# Patient Record
Sex: Female | Born: 1990 | Race: White | Hispanic: No | Marital: Single | Smoking: Never smoker
Health system: Southern US, Community
[De-identification: ages and names within clinical notes are randomized; demographics above are authoritative.]

## PROBLEM LIST (undated history)

## (undated) DIAGNOSIS — Z8619 Personal history of other infectious and parasitic diseases: Secondary | ICD-10-CM

## (undated) DIAGNOSIS — R87612 Low grade squamous intraepithelial lesion on cytologic smear of cervix (LGSIL): Secondary | ICD-10-CM

## (undated) DIAGNOSIS — J029 Acute pharyngitis, unspecified: Principal | ICD-10-CM

## (undated) DIAGNOSIS — J321 Chronic frontal sinusitis: Secondary | ICD-10-CM

## (undated) DIAGNOSIS — N871 Moderate cervical dysplasia: Secondary | ICD-10-CM

## (undated) DIAGNOSIS — K219 Gastro-esophageal reflux disease without esophagitis: Secondary | ICD-10-CM

## (undated) DIAGNOSIS — J45990 Exercise induced bronchospasm: Secondary | ICD-10-CM

## (undated) DIAGNOSIS — R41840 Attention and concentration deficit: Secondary | ICD-10-CM

## (undated) DIAGNOSIS — L709 Acne, unspecified: Secondary | ICD-10-CM

## (undated) DIAGNOSIS — N946 Dysmenorrhea, unspecified: Secondary | ICD-10-CM

## (undated) HISTORY — DX: Low grade squamous intraepithelial lesion on cytologic smear of cervix (LGSIL): R87.612

## (undated) HISTORY — DX: Exercise induced bronchospasm: J45.990

## (undated) HISTORY — DX: Attention and concentration deficit: R41.840

## (undated) HISTORY — DX: Acute pharyngitis, unspecified: J02.9

## (undated) HISTORY — PX: MYRINGOTOMY: SUR874

## (undated) HISTORY — DX: Dysmenorrhea, unspecified: N94.6

## (undated) HISTORY — DX: Personal history of other infectious and parasitic diseases: Z86.19

## (undated) HISTORY — DX: Acne, unspecified: L70.9

## (undated) HISTORY — DX: Moderate cervical dysplasia: N87.1

## (undated) HISTORY — DX: Chronic frontal sinusitis: J32.1

## (undated) HISTORY — DX: Gastro-esophageal reflux disease without esophagitis: K21.9

---
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
## 2010-01-04 ENCOUNTER — Ambulatory Visit: Payer: Self-pay | Admitting: Internal Medicine

## 2010-01-04 ENCOUNTER — Telehealth: Payer: Self-pay | Admitting: Internal Medicine

## 2010-01-07 ENCOUNTER — Encounter: Payer: Self-pay | Admitting: Internal Medicine

## 2010-01-11 ENCOUNTER — Ambulatory Visit: Payer: Self-pay | Admitting: Internal Medicine

## 2010-02-25 ENCOUNTER — Telehealth: Payer: Self-pay | Admitting: Internal Medicine

## 2010-04-02 ENCOUNTER — Telehealth: Payer: Self-pay | Admitting: *Deleted

## 2010-04-03 ENCOUNTER — Ambulatory Visit: Payer: Self-pay | Admitting: Internal Medicine

## 2010-04-03 ENCOUNTER — Telehealth (INDEPENDENT_AMBULATORY_CARE_PROVIDER_SITE_OTHER): Payer: Self-pay | Admitting: *Deleted

## 2010-04-03 DIAGNOSIS — J029 Acute pharyngitis, unspecified: Secondary | ICD-10-CM | POA: Insufficient documentation

## 2010-04-03 LAB — CONVERTED CEMR LAB: Rapid Strep: NEGATIVE

## 2010-04-04 ENCOUNTER — Ambulatory Visit: Payer: Self-pay | Admitting: Internal Medicine

## 2010-04-05 ENCOUNTER — Ambulatory Visit: Payer: Self-pay | Admitting: Family Medicine

## 2010-04-05 LAB — CONVERTED CEMR LAB: Rapid Strep: NEGATIVE

## 2010-04-06 ENCOUNTER — Encounter: Payer: Self-pay | Admitting: Internal Medicine

## 2010-04-09 ENCOUNTER — Ambulatory Visit: Payer: Self-pay | Admitting: Internal Medicine

## 2010-04-10 ENCOUNTER — Ambulatory Visit
Admission: RE | Admit: 2010-04-10 | Discharge: 2010-04-10 | Payer: Self-pay | Source: Home / Self Care | Attending: Internal Medicine | Admitting: Internal Medicine

## 2010-04-10 DIAGNOSIS — K639 Disease of intestine, unspecified: Secondary | ICD-10-CM | POA: Insufficient documentation

## 2010-04-29 ENCOUNTER — Encounter: Payer: Self-pay | Admitting: Internal Medicine

## 2010-05-01 ENCOUNTER — Encounter: Admission: RE | Admit: 2010-05-01 | Discharge: 2010-05-01 | Payer: Self-pay | Source: Home / Self Care

## 2010-05-16 NOTE — Progress Notes (Signed)
Summary: refill med  Phone Note Call from Patient Call back at [phone]   Caller: Mom Call For: panosh Summary of Call: Wants to change the methylphenidate from 20mg  to 30mg .  The 20mg  is still not enough but the 10mg  is ok so she needs a refill on that one to.  Also she had a sinus infection on Wednesday and missed her exercise class.  The instructor is asking for a dr note and she wants to know if you will give her one. Initial call taken by: Alfred Levins, CMA,  February 25, 2010 8:43 AM  Follow-up for Phone Call        Doies she mean  methylphenidate er 30 mg? ok  1 by mouth once daily  disp 30 . and refill  the immediate release [facility] 10 mg disp#30   Note excuse  wont help  as i  would have to say that  her illness was  self reported.  "  Patient said she had sinus infection and missed class for medical reasons ".   Follow-up by: Madelin Headings MD,  February 25, 2010 5:22 PM  Additional Follow-up for Phone Call Additional follow up Details #1::        rx up front, ready for p/u, mom aware Additional Follow-up by: Alfred Levins, CMA,  February 25, 2010 5:36 PM    New/Updated Medications: METADATE CD 30 MG CR-CAPS (METHYLPHENIDATE HCL) 1 by mouth once daily Prescriptions: METHYLPHENIDATE HCL 10 MG TABS (METHYLPHENIDATE HCL) 1/2 to one in afternoon for homework if needed  #30 x 0   Entered by:   Alfred Levins, CMA   Authorized by:   Madelin Headings MD   Signed by:   Alfred Levins, CMA on 02/25/2010   Method used:   Print then Give to Patient   RxID:   [id number] METADATE CD 30 MG CR-CAPS (METHYLPHENIDATE HCL) 1 by mouth once daily  #30 x 0   Entered by:   Alfred Levins, CMA   Authorized by:   Madelin Headings MD   Signed by:   Alfred Levins, CMA on 02/25/2010   Method used:   Print then Give to Patient   RxID:   [id number]

## 2010-05-16 NOTE — Assessment & Plan Note (Signed)
Summary: flu shot/ssc  Nurse Visit   Allergies: No Known Drug Allergies  Immunizations Administered:  Influenza Vaccine # 1:    Vaccine Type: Fluvax 3+    Site: left deltoid    Mfr: GlaxoSmithKline    Dose: 0.5 ml    Route: IM    Given by: Kathrynn Speed CMA    Exp. Date: 10/12/2010    Lot #: [id number]    VIS given: 11/06/09 version given January 04, 2010.  Flu Vaccine Consent Questions:    Do you have a history of severe allergic reactions to this vaccine? no    Any prior history of allergic reactions to egg and/or gelatin? no    Do you have a sensitivity to the preservative Thimersol? no    Do you have a past history of Guillan-Barre Syndrome? no    Do you currently have an acute febrile illness? no    Have you ever had a severe reaction to latex? no    Vaccine information given and explained to patient? yes    Are you currently pregnant? no  Orders Added: 1)  Flu Vaccine 60yrs + [90658] 2)  Admin 1st Vaccine [44010]

## 2010-05-16 NOTE — Assessment & Plan Note (Signed)
Summary: FUP--OK PER SHANNON//CCM   Vital Signs:  Patient profile:   20 year old female Menstrual status:  regular LMP:     06/13/2009 Weight:      146 pounds Pulse rate:   66 / minute BP sitting:   120 / 60  (right arm) Cuff size:   regular  Vitals Entered By: Romualdo Bolk, CMA (AAMA) (June 22, 2009 1:48 PM)  History of Present Illness: Amanda Garrison comesin today for   for follow up of a number of issues. OCPS:   Loestrin 24 much better for cramps regulation  than previous loestrin.  Acne better also. SA    no dysuria abd pain vag signs . No breast problems HA or cp sob except  related to   EIA.    EIA: uses inhaler when thinks of it . no other exacerbations  see phone notes.   SLeep . Had terrible insomnia for a month or so at beginning of semester that was problematic but much better since spring break . used melatonin no other meds . lifestyle intervention .   other . denies physical cause  noted or pain.     had different school scedule early am some days which was a change in pattern. some stress over school but not felt to be enough to cause sig proble,    denies Depressive signs .   Does study lat into night with laptop but has done this before.   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    no ets  college   [employer]  freshman  current psychology major   Review of Systems  The patient denies anorexia, fever, weight loss, weight gain, vision loss, decreased hearing, hoarseness, chest pain, syncope, dyspnea on exertion, peripheral edema, prolonged cough, headaches, hemoptysis, abdominal pain, melena, hematochezia, severe indigestion/heartburn, hematuria, genital sores, difficulty walking, depression, unusual weight change, abnormal bleeding, enlarged lymph nodes, angioedema, and breast masses.         except as per HPI  Physical Exam  General:      Well appearing adolescent,no acute distress Head:      normocephalic and atraumatic  Eyes:      PERRL, EOMs full, conjunctiva clear   Ears:      TM's pearly gray with normal light reflex and landmarks, canals clear  Nose:      Clear without Rhinorrhea Mouth:      Clear without erythema, edema or exudate, mucous membranes moist teeth in good rpair Neck:      supple without adenopathy  Lungs:      Clear to ausc, no crackles, rhonchi or wheezing, no grunting, flaring or retractions  Heart:  RRR without murmur quiet precordium.   Abdomen:      BS+, soft, non-tender, no masses, no hepatosplenomegaly  Pulses:      pulses intact without delay   Extremities:      no clubbing cyanosis or edema  Neurologic:      Neurologic exam  intact  non focal  Skin:      intact without lesions, rashes  Cervical nodes:      no significant adenopathy.   Psychiatric:      alert and cooperative  nl affect and  cognition and speech.  Impression & Recommendations:  Problem # 1:  DYSMENORRHEA (ICD-625.3) Assessment Improved continue on loestrin 24 Orders: Hgb (09811)  Problem # 2:  ACNE (ICD-706.1) continue  The following medications were removed from the medication list:    Bactrim Ds 800-160 Mg Tabs (Sulfamethoxazole-trimethoprim) .Marland Kitchen... 1 by mouth once daily    Amoxicillin-pot  Clavulanate 875-125 Mg Tabs (Amoxicillin-pot clavulanate) .Marland Kitchen... 1 by mouth two times a day  for sinusitis    Zithromax Z-pak 250 Mg Tabs (Azithromycin) .Marland Kitchen... 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 2 puffs every 4-6 hours as needed  Problem # 4:  PREVENTIVE HEALTH CARE (ICD-V70.0) disc screening and options      Orders: Hgb (91478) T-Chlamydia & GC Probe, Urine (87491/[id number])  Problem # 5:  GERD (ICD-530.81) stable  Her updated medication list for this problem includes:    Ranitidine Hcl 300 Mg Tabs (Ranitidine hcl) .Marland Kitchen... Take 1 tablet twice a day  Problem # 6:  UNSPECIFIED SLEEP DISTURBANCE (ICD-780.50) has been problematic but better currently.  counseled  about sleep hygiene and  circadian rhythyms and issues that effect sleep . rec against meds  as a solution although can be used for sleep cycling if needed.  if recurring consider  CBT counseling .  Also look at her class schedules.   sleep diary given . tracking for a few weeks may be helpful to her.   Complete Medication List: 1)  Proair Hfa 108 (90 Base) Mcg/act Aers (Albuterol sulfate) .... 2 puffs every 4-6 hours as needed 2)  Loestrin 24 Fe 1-20 Mg-mcg Tabs (Norethin ace-eth estrad-fe) .Marland Kitchen.. 1 by mouth once daily 3)  Ranitidine Hcl 300 Mg Tabs (Ranitidine hcl) .... Take 1 tablet twice a day  Patient Instructions: 1)  sleep diary    2)  You will be informed of lab results when available.  3)  otherwise     rov in 6-12 months  Prescriptions: LOESTRIN 24 FE 1-20 MG-MCG TABS (NORETHIN ACE-ETH ESTRAD-FE) 1 by mouth once daily  #1 pack x 12   Entered and Authorized by:   Madelin Headings MD   Signed by:   Madelin Headings MD on 06/22/2009   Method used:   Electronically to        [facility]       [address]       [place], [place]   [address]       Ph: [phone]       Fax: [phone]   RxID:   [id number] PROAIR HFA 108 (90 BASE) MCG/ACT  AERS (ALBUTEROL SULFATE) 2 puffs every 4-6 hours as needed  #1 x 3   Entered and Authorized by:   [name] Paulette Lynch  MD   Signed by:   Madelin Headings MD on 06/22/2009   Method used:   Electronically to        [facility])       [address]       [place], [place]  [address]       Ph: [phone]       Fax: [phone]   RxID:   [id number]  ] Laboratory Results   Blood Tests   Date/Time Recieved: June 22, 2009 2:52 PM  Date/Time Reported: June 22, 2009 2:52 PM    CBC HGB:  12.0 g/dL   (Normal Range: 40.1-02.7 in Males, 12.0-15.0 in Females) Comments: Wynona Canes, CMA  June 22, 2009 2:52 PM

## 2010-05-16 NOTE — Progress Notes (Signed)
Summary: rx for retin a micro  Phone Note Call from Patient Call back at [phone]   Caller: Mom-Terri Summary of Call: Pt needs a refill on Retin A Micro 0.1% gel. Pt does see a Derm for this but can't get in until she goes to college this fall. Mom is wondering if we could call this rx in for her.  Initial call taken by: Romualdo Bolk, CMA Duncan Dull),  October 03, 2009 2:16 PM  Follow-up for Phone Call        ok x 2  Follow-up by: Madelin Headings MD,  October 03, 2009 8:02 PM  Additional Follow-up for Phone Call Additional follow up Details #1::        Rx sent to pharmacy and mom aware of results. Additional Follow-up by: Romualdo Bolk, CMA (AAMA),  October 04, 2009 9:27 AM    New/Updated Medications: RETIN-A MICRO 0.1 % GEL (TRETINOIN MICROSPHERE) use as directed Prescriptions: RETIN-A MICRO 0.1 % GEL (TRETINOIN MICROSPHERE) use as directed  #30 x 1   Entered by:   Romualdo Bolk, CMA (AAMA)   Authorized by:   Madelin Headings MD   Signed by:   Romualdo Bolk, CMA (AAMA) on 10/04/2009   Method used:   Electronically to        [facility]       [address]       [place], [place]  [address]       Ph: [phone]       Fax: [phone]   RxID:   [id number]

## 2010-05-16 NOTE — Assessment & Plan Note (Signed)
Summary: SORETHROAT/FEVER/SWOLLEN GLANDS/CJR   Vital Signs:  Patient profile:   20 year old female Menstrual status:  regular Weight:      142 pounds O2 Sat:      99 % Temp:     98.6 degrees F Pulse rate:   78 / minute BP sitting:   104 / 60  (left arm)  Vitals Entered By: Pura Spice, RN (April 05, 2010 2:29 PM) CC: sore throat x 4 days "been sick for couple weeks" , URI symptoms   History of Present Illness:       This is a 20 year old woman who presents with URI symptoms.  The patient complains of nasal congestion, purulent nasal discharge, sore throat, and dry cough.  The patient denies fever.  The patient also reports headache.  The patient denies itchy watery eyes and sneezing.  Risk factors for Strep sinusitis include poor response to decongestant and tender adenopathy.  The patient denies the following risk factors for Strep sinusitis: unilateral facial pain and tooth pain.    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  Otoscopic examination reveals clear canals, tympanic membranes are intact bilaterally without bulging, retraction, inflammation or discharge. Hearing is grossly normal bilaterally. Mouth:  OP post vesicles/ulcers.  No exudate. Neck:  tender AC nodes Lungs:  Normal respiratory effort, chest expands symmetrically. Lungs are clear to auscultation, no crackles or wheezes. Heart:  Normal rate and regular rhythm. S1 and S2 normal without gallop, murmur, click, rub or other extra sounds.   Impression & Recommendations:  Problem # 1:  ACUTE  PHARYNGITIS (ICD-462)  rapid strep neg.  Suspect viral with appearance of post pharynx.  Supportive care. Her updated medication list for this problem includes:    Amoxicillin-pot Clavulanate 875-125 Mg Tabs (Amoxicillin-pot clavulanate) ..... One by mouth two times a day for 10 days  Orders: Rapid Strep (30865)  Problem # 2:  ? of OTHER ACUTE SINUSITIS (ICD-461.8) ?lingering acute sinusits.  No antibiotics rec at this time but start if recurrent facial pain, fever, purulent nasal discharge. Her updated medication list for this problem includes:    Amoxicillin-pot Clavulanate 875-125 Mg Tabs (Amoxicillin-pot clavulanate) ..... One by mouth two times a day for 10 days  Complete Medication List: 1)  Proair Hfa 108 (90 Base) Mcg/act Aers (Albuterol sulfate) .... 2 puffs every 4-6 hours as needed 2)  Loestrin 24 Fe 1-20 Mg-mcg Tabs (Norethin ace-eth estrad-fe) .Marland Kitchen.. 1 by mouth once daily 3)  Ranitidine Hcl 300 Mg Tabs (Ranitidine hcl) .... Take 1 tablet twice a day 4)  Retin-a Micro 0.1 % Gel (Tretinoin microsphere) .... Use as directed 5)  Metadate Cd 30 Mg Cr-caps (Methylphenidate hcl) .Marland Kitchen.. 1 by mouth once daily 6)  Methylphenidate Hcl 10 Mg Tabs (Methylphenidate hcl) .... 1/2 to one in afternoon for homework if needed 7)  Amoxicillin-pot Clavulanate 875-125 Mg Tabs (Amoxicillin-pot clavulanate) .... One by mouth two times a day for 10 days 8)  Hydrocodone-acetaminophen 5-325 Mg Tabs (Hydrocodone-acetaminophen) .Marland Kitchen.. 1-2 by mouth q 4-6 hours as needed Prescriptions: HYDROCODONE-ACETAMINOPHEN 5-325 MG TABS (HYDROCODONE-ACETAMINOPHEN)  1-2 by mouth q 4-6 hours as needed  #20 x 0   Entered and Authorized by:   Evelena Peat MD   Signed by:   Evelena Peat MD on 04/05/2010   Method used:   Print then Give to Patient   RxID:   [id number] AMOXICILLIN-POT CLAVULANATE 875-125 MG TABS (AMOXICILLIN-POT CLAVULANATE) one by mouth two times a day for 10 days  #20 x 0   Entered and Authorized  by:   Evelena Peat MD   Signed by:   Evelena Peat MD on 04/05/2010   Method used:   Print then Give to Patient   RxID:   [id number]    Orders Added: 1)  Est. Patient Level III [84696] 2)  Rapid Strep [29528]    Laboratory Results    Other Tests  Rapid Strep: negative Comments: Rita Ohara  April 05, 2010 2:22 PM   Kit Test Internal QC: Negative   (Normal Range: Negative)

## 2010-05-16 NOTE — Letter (Signed)
Summary: [facility]   Imported By: Maryln Gottron 01/17/2010 08:51:12  _____________________________________________________________________  External Attachment:    Type:   Image     Comment:   External Document

## 2010-05-16 NOTE — Progress Notes (Signed)
Summary: Change in her ADD Meds needs a Friday PM appt   Phone Note Call from Patient Call back at [phone]   Caller: Mom-Terri Summary of Call: Pt is going [place] [facility] on Friday 9/23 and wanted to come in to see [place] that same day about medication changes. Mom is aware that Dr. Fabian Sharp is out of the office that day. Pt is going to drop off a note from Dr. Jennette Banker visit to let us know what he thinks as well. I told mom that I would see if Dr. Fabian Sharp could maybe change her medication over the phone then have her come in on a friday in 1 month but that I COULDN'T BE SURE THIS WOULD HAPPEN AND THAT SHE MAY STILL NEED TO HAVE TO COME IN BEFORE WE COULD CHANGE THE MEDICATION. Mom agree that either way would be fine but she would need a friday pm appt because pt can only come home on fridays. Pt to bring in note after seeing Dr. Ledon Snare to me personally. Initial call taken by: Romualdo Bolk, CMA Duncan Dull),  December 21, 2009 10:27 AM  Follow-up for Phone Call        i agree need  to review evaluations  and psychologist recs   before deciding on medicine management .     Needs OV and  information before the visit  I will not be in the office on Sept 23  but will be on Sept 30th  Follow-up by: Madelin Headings MD,  December 21, 2009 1:18 PM  Additional Follow-up for Phone Call Additional follow up Details #1::        Left message on machine about above. Additional Follow-up by: Romualdo Bolk, CMA Duncan Dull),  December 21, 2009 4:53 PM     Appended Document: Change in her ADD Meds needs a Friday PM appt  See if pt can come in at 12:45pm per Dr. Fabian Sharp  Appended Document: Change in her ADD Meds needs a Friday PM appt  Mom aware appt made for 11:15am but will call pt to make sure she can come in that slot and call back to confirm appt.

## 2010-05-16 NOTE — Progress Notes (Signed)
Summary: Pts needs work in med check before 04/20/10  Phone Note Call from Patient Call back at [phone] Terrys cell    Caller: mom-Terry Summary of Call: Pts daughter need fup med check before she goes back to college on Jan 7th 2012. Pt will be in town from 04/02/10 to 04/20/10. Pls advise.  Initial call taken by: Lucy Antigua,  April 02, 2010 11:45 AM  Follow-up for Phone Call        Mom called saying that pt is doing fine with meds. Pt is having sore throat with white spots following 2 weeks of a URI. Mom wants pt worked in first thing in the am. Can we have her come in for just a strep test then be seen on thurs? Mom doesn't want to take her to Urgent Care tonight. Follow-up by: Romualdo Bolk, CMA Duncan Dull),  April 02, 2010 4:42 PM  Additional Follow-up for Phone Call Additional follow up Details #1::        Left message on machine to come in at 8:30am for a follow up on meds. Also pt to come in at 8:30 on 12/27 for a follow. I told mom to leave a message on my machine about appts. Additional Follow-up by: Romualdo Bolk, CMA Duncan Dull),  April 02, 2010 5:25 PM    Additional Follow-up for Phone Call Additional follow up Details #2::    Pt aware and appt made. Follow-up by: Romualdo Bolk, CMA Duncan Dull),  April 03, 2010 8:47 AM

## 2010-05-16 NOTE — Progress Notes (Signed)
Summary: fever and swollen glands  Phone Note Call from Patient Call back at [phone]   Caller: Mom-Terri Summary of Call: Pt was feeling fine when she came in this am but then this afternoon she started running a fever, swollen glands. Temp 101. Mom states that pt told her that she is that unsure that they even got a good swab. Pt stated that she swabbed the back of her tongue.  Initial call taken by: Romualdo Bolk, CMA Duncan Dull),  April 03, 2010 5:00 PM  Follow-up for Phone Call        Per Tim Lair- Pt can come in tomorrow and get another swab done and we don't need to charge her. Follow-up by: Romualdo Bolk, CMA (AAMA),  April 03, 2010 5:00 PM

## 2010-05-16 NOTE — Letter (Signed)
Summary: Generic Letter  [place] [facility]  [address] [place], [place] [address]   Phone: [phone]  Fax: [phone]    07/11/2009  [facility] [address] [place], [place]  [address]  Dear Ms. Witherell,  We have tried to contact you by phone about your results. Your test were neg. If you have any other questions, please call us at [phone]         Sincerely,   Tor Netters, CMA (AAMA)

## 2010-05-16 NOTE — Assessment & Plan Note (Signed)
Summary: follow up on meds/ssc   Vital Signs:  Patient profile:   20 year old female Menstrual status:  regular LMP:     03/27/2010 Height:      70 inches Weight:      138 pounds Pulse rate:   72 / minute BP sitting:   110 / 70  (right arm) Cuff size:   regular  Vitals Entered By: Amanda Garrison, CMA (AAMA) (April 10, 2010 11:28 AM) CC: Follow-up visit on meds LMP (date): 03/27/2010 LMP - Character: heavy Menarche (age onset years): 16   Menses interval (days): 28 Menstrual flow (days): 4 Enter LMP: 03/27/2010   History of Present Illness: Amanda Garrison comes in today  for follow up of a number of issues. #REcent sinusitis and throat infection felt to be viral but started on the antibiotic and on the right getting better on augmentin .   also given  hydrocodone . #Had viral gi illness yesterday and didnt come in for presceduled appt then but is doing better today. doesnt think its the medication. either augmentin or hydrocodone.  #add meds : doing better on the methyphenidate  and lasts about 6-7 hours  adding IR lasting 2 or so hours for Amanda Garrison and other times.  Amanda Garrison seems to think it works well awith out  mood se.  #OCPS: doing well no Co needs refill . #Acne : needs refill of retina   #GI: needs refill of ranitidine   ..works well    Press photographer & Management  Alcohol-Tobacco     Alcohol drinks/day: 0     Smoking Status: never  Caffeine-Diet-Exercise     Caffeine use/day: less than 1 a day     Does Patient Exercise: yes  Current Medications (verified): 1)  Proair Hfa 108 (90 Base) Mcg/act  Aers (Albuterol Sulfate) .... 2 Puffs Every 4-6 Hours As Needed 2)  Loestrin 24 Fe 1-20 Mg-Mcg Tabs (Norethin Ace-Eth Estrad-Fe) .Marland Kitchen.. 1 By Mouth Once Daily 3)  Ranitidine Hcl 300 Mg  Tabs (Ranitidine Hcl) .... Take 1 Tablet Twice A Day 4)  Retin-A Micro 0.1 % Gel (Tretinoin Microsphere) .... Use As Directed 5)  Metadate Cd 30 Mg Cr-Caps (Methylphenidate  Hcl) .Marland Kitchen.. 1 By Mouth Once Daily 6)  Methylphenidate Hcl 10 Mg Tabs (Methylphenidate Hcl) .... 1/2 To One in Afternoon For Homework If Needed 7)  Amoxicillin-Pot Clavulanate 875-125 Mg Tabs (Amoxicillin-Pot Clavulanate) .... One By Mouth Two Times A Day For 10 Days 8)  Hydrocodone-Acetaminophen 5-325 Mg Tabs (Hydrocodone-Acetaminophen) .Marland Kitchen.. 1-2 By Mouth Q 4-6 Hours As Needed  Allergies (verified): No Known Drug Allergies  Past History:  Past medical, surgical, family and social histories (including risk factors) reviewed, and no changes noted (except as noted below).  Past Medical History: Reviewed history from 06/22/2009 and no changes required. 8# 9 oz recurrent om Mono 10/06 EIA       GERD  neg endo otherwise 2010 Varicella disease  Past Surgical History: Reviewed history from 06/22/2009 and no changes required. Myringotomy: tubes     1997 and 1998  Past History:  Care Management: Dermatology: Amanda Garrison Gastroenterology: Amanda Garrison Psychiatry: Amanda Garrison  Family History: Reviewed history from 01/11/2010 and no changes required. neg for clotting    sib on meds for adhd  Social History: Reviewed history from 01/11/2010 and no changes required. sleep ok  hh 5  1 dog.    no ets  college   Amanda Garrison    current psychology major   Review of Systems  The patient denies chest pain, melena, hematochezia, severe indigestion/heartburn, hematuria, difficulty walking, abnormal bleeding, angioedema, prolonged cough, headaches, and hemoptysis.         see hpi   some anorexia no fever currently  swollen glands getting better   Physical Exam  General:  minimally ill in nad some congestion Head:  normocephalic and atraumatic.   Eyes:  PERRL, EOMs full, conjunctiva clear  Ears:  R ear normal, L ear normal, and no external deformities.   Nose:  no external deformity and no external erythema.   Mouth:  slight erythema right more than left no exudate no edema  no facial tenderness today Neck:   tender right ac nodes neg pc nodes  Lungs:  Normal respiratory effort, chest expands symmetrically. Lungs are clear to auscultation, no crackles or wheezes. Heart:  Normal rate and regular rhythm. S1 and S2 normal without gallop, murmur, click, rub or other extra sounds. Abdomen:  Bowel sounds positive,abdomen soft and non-tender without masses, organomegaly or hernias noted. Pulses:  nl cap refill  Neurologic:  non focal  nl affect   no tremor or tics Skin:  turgor normal, color normal, no ecchymoses, and no petechiae.   Cervical Nodes:  see neck exam  Psych:  Oriented X3, normally interactive, good eye contact, not anxious appearing, and not depressed appearing.     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      . reviewed use and safety  consider taking low dose on weekends to avoid  the off and on phenomenon.  Problem # 2:  ? of OTHER ACUTE SINUSITIS (ICD-461.8) Assessment: Improved  Her updated medication list for this problem includes:    Amoxicillin-pot Clavulanate 875-125 Mg Tabs (Amoxicillin-pot clavulanate) ..... One by mouth two times a day for 10 days  Problem # 3:  DYSMENORRHEA (ICD-625.3) Assessment: Improved ok on ocps   Problem # 4:  ACNE (ICD-706.1) continue topicals Her updated medication list for this problem includes:    Retin-a Micro 0.1 % Gel (Tretinoin microsphere) ..... Use as directed    Amoxicillin-pot Clavulanate 875-125 Mg Tabs (Amoxicillin-pot clavulanate) ..... One by mouth two times a day for 10 days  Problem # 5:  UNSPECIFIED SLEEP DISTURBANCE (ICD-780.50) ? if better over holiday    Problem # 6:  gi disturbance  acts like viral  or eating related and not to the meds as is doing better today ... reviewed findings.   Complete Medication List: 1)  Proair Hfa 108 (90 Base) Mcg/act Aers (Albuterol sulfate) .... 2 puffs every 4-6 hours as needed 2)  Loestrin 24 Fe 1-20  Mg-mcg Tabs (Norethin ace-eth estrad-fe) .Marland Kitchen.. 1 by mouth once daily 3)  Ranitidine Hcl 300 Mg Tabs (Ranitidine hcl) .... Take 1 tablet twice a day 4)  Retin-a Micro 0.1 % Gel (Tretinoin microsphere) .... Use as directed 5)  Metadate Cd 30 Mg Cr-caps (Methylphenidate hcl) .Marland Kitchen.. 1 by mouth once daily 6)  Methylphenidate Hcl 10 Mg Tabs (Methylphenidate hcl) .... 1/2 to one in afternoon for homework if needed 7)  Amoxicillin-pot Clavulanate 875-125 Mg Tabs (Amoxicillin-pot clavulanate) .... One by mouth two times a day for 10 days 8)  Hydrocodone-acetaminophen 5-325 Mg Tabs (Hydrocodone-acetaminophen) .Marland Kitchen.. 1-2 by mouth q 4-6 hours as needed  Patient Instructions: 1)  call for refills  2)  wellness check in 6 months  ( after school out is ok) and for med check then  Prescriptions: METHYLPHENIDATE HCL 10 MG TABS (METHYLPHENIDATE HCL) 1/2 to one in afternoon for homework if needed  #90 x 0   Entered and Authorized by:   Madelin Headings MD   Signed by:   Madelin Headings MD on 04/10/2010   Method used:   Print then Give to Patient   RxID:   [id number] METADATE CD 30 MG CR-CAPS (METHYLPHENIDATE HCL) 1 by mouth once daily  #90 x 0   Entered and Authorized by:   Madelin Headings MD   Signed by:   Madelin Headings MD on 04/10/2010   Method used:   Print then Give to Patient   RxID:   [id number] RETIN-A MICRO 0.1 % GEL (TRETINOIN MICROSPHERE) use as directed  #90 x 3   Entered by:   Amanda Garrison, CMA (AAMA)   Authorized by:   Madelin Headings MD   Signed by:   Amanda Garrison, CMA (AAMA) on 04/10/2010   Method used:   Print then Give to Patient   RxID:   [id number] RANITIDINE HCL 300 MG  TABS (RANITIDINE HCL) Take 1 tablet twice a day  #180 x 3   Entered by:   Amanda Garrison, CMA (AAMA)   Authorized by:   Madelin Headings MD   Signed by:   Amanda Garrison, CMA (AAMA) on 04/10/2010   Method used:   Print then Give to Patient   RxID:   [id number] LOESTRIN 24 FE 1-20  MG-MCG TABS (NORETHIN ACE-ETH ESTRAD-FE) 1 by mouth once daily  #3 packs x 3   Entered by:   Amanda Garrison, CMA (AAMA)   Authorized by:   Madelin Headings MD   Signed by:   Amanda Garrison, CMA (AAMA) on 04/10/2010   Method used:   Print then Give to Patient   RxID:   [id number]    Orders Added: 1)  Est. Patient Level IV [99371]

## 2010-05-16 NOTE — Progress Notes (Signed)
Summary: refill  Phone Note Call from Patient Call back at Home Phone [phone]   Caller: Dad Summary of Call: needs a refill of her ranitidine prescription. Dad  was wondering if you could refill it for a year. Intial prescription was from Yancey Flemings but since she has well controlled, uncomplicated GERD she will follow up with you for this problem.  Initial call taken by: Romualdo Bolk, CMA (AAMA),  November 16, 2009 1:25 PM  Follow-up for Phone Call        Per Dr. Fabian Sharp okay to do for 1 year. Rx sent to the pharmacy. Follow-up by: Romualdo Bolk, CMA (AAMA),  November 16, 2009 1:26 PM    Prescriptions: RANITIDINE HCL 300 MG  TABS (RANITIDINE HCL) Take 1 tablet twice a day  #60 x 11   Entered by:   Romualdo Bolk, CMA (AAMA)   Authorized by:   Madelin Headings MD   Signed by:   Romualdo Bolk, CMA (AAMA) on 11/16/2009   Method used:   Electronically to        [facility] ([facility]ail[facility]       [address]       [place], [place]  [address]       Ph: [phone]       Fax: [phone]   RxID:   [id number]

## 2010-05-16 NOTE — Progress Notes (Signed)
Summary: please return call today  Phone Note Call from Patient Call back at [phone]   Caller: dr Festus Aloe call Summary of Call: wants to speak [facility] only. Please return his call. Initial call taken by: Warnell Forester,  January 04, 2010 10:15 AM  Follow-up for Phone Call        Left message for Dr. Ledon Snare to fax over a note to Dr. Fabian Sharp with info on what he wants to discuss with her.  Follow-up by: Romualdo Bolk, CMA (AAMA),  January 04, 2010 1:16 PM  Additional Follow-up for Phone Call Additional follow up Details #1::        Left message for Dr. Westley Foots to call back.  Additional Follow-up by: Romualdo Bolk, CMA Duncan Dull),  January 07, 2010 12:02 PM    Additional Follow-up for Phone Call Additional follow up Details #2::    I called Dr. Jennette Banker office again and left a message to call back and also left another fax number where he could fax [place] a note.  Romualdo Bolk, CMA (AAMA)  January 07, 2010 2:16 PM Spoke to Dr. Hans Eden is has done all the eval short of testing her. He is going to ask [facility] for [employer]. She has typical add stuff. Mild to moderate ADD. Needs it more for situational purpose, ie writing papers, studing for test and exams. His reccomendation  Ritalin 5mg  1 two times a day. This would give the flex to study to for test, exams and writing papers. He thinks she a good kid and wouldn't sale this medication to anyone.  Romualdo Bolk, CMA Duncan Dull)  January 07, 2010 2:22 PM   Additional Follow-up for Phone Call Additional follow up Details #3:: Details for Additional Follow-up Action Taken: note reviewed   schedule OV  Additional Follow-up by: Madelin Headings MD,  January 10, 2010 11:56 AM

## 2010-05-16 NOTE — Assessment & Plan Note (Signed)
Summary: Discuss going on add meds/ssc   Vital Signs:  Patient profile:   20 year old female Menstrual status:  regular LMP:     12/21/2009 Height:      70 inches Weight:      136 pounds BMI:     19.58 Pulse rate:   78 / minute BP sitting:   110 / 70  (right arm) Cuff size:   regular  Vitals Entered By: Romualdo Bolk, CMA (AAMA) (January 11, 2010 11:23 AM) CC: Discuss going add meds LMP (date): 12/21/2009 LMP - Character: heavy Menarche (age onset years): 16   Menses interval (days): 28 Menstrual flow (days): 4 Enter LMP: 12/21/2009   History of Present Illness: Amanda Garrison comesin with mom for visit  sent by [facility] psychologist who has seen her over the years.  She apparently has had attentional problems since younger and worse in academia in 8th grade . However coped pretty well until recently .     She seems to have sx of impaired  sleep and over focus.   difficultly changing  tasks and  getting overwhelmed .  with big tasks at school .Marland Kitchen She is isn college at [employer].   No other health issues bothering her now . has hx of gerd and asthma .  Preventive Screening-Counseling & Management  Alcohol-Tobacco     Alcohol drinks/day: 0     Smoking Status: never  Caffeine-Diet-Exercise     Caffeine use/day: less than 1 a day     Does Patient Exercise: yes  Current Medications (verified): 1)  Proair Hfa 108 (90 Base) Mcg/act  Aers (Albuterol Sulfate) .... 2 Puffs Every 4-6 Hours As Needed 2)  Loestrin 24 Fe 1-20 Mg-Mcg Tabs (Norethin Ace-Eth Estrad-Fe) .Marland Kitchen.. 1 By Mouth Once Daily 3)  Ranitidine Hcl 300 Mg  Tabs (Ranitidine Hcl) .... Take 1 Tablet Twice A Day 4)  Retin-A Micro 0.1 % Gel (Tretinoin Microsphere) .... Use As Directed  Allergies (verified): No Known Drug Allergies  Past History:  Past medical, surgical, family and social histories (including risk factors) reviewed, and no changes noted (except as noted below).  Past Medical History: Reviewed  history from 06/22/2009 and no changes required. 8# 9 oz recurrent om Mono 10/06 EIA       GERD  neg endo otherwise 2010 Varicella disease  Past Surgical History: Reviewed history from 06/22/2009 and no changes required. Myringotomy: tubes     1997 and 1998  Past History:  Care Management: Dermatology: Nicholas Lose Gastroenterology: Marina Goodell Psychiatry: Ledon Snare  Family History: Reviewed history from 02/21/2008 and no changes required. neg for clotting    sib on meds for adhd  Social History: Reviewed history from 06/22/2009 and no changes required. sleep ok  hh 5  1 dog.    no ets  college   [employer]    current psychology major   Review of Systems  The patient denies anorexia, fever, chest pain, syncope, dyspnea on exertion, peripheral edema, prolonged cough, abdominal pain, difficulty walking, depression, abnormal bleeding, and enlarged lymph nodes.         sleep    Physical Exam  General:  Well-developed,well-nourished,in no acute distress; alert,appropriate and cooperative throughout examination Head:  normocephalic and atraumatic.   Eyes:  vision grossly intact, pupils equal, and pupils round.   Neck:  No deformities, masses, or tenderness noted. Lungs:  Normal respiratory effort, chest expands symmetrically. Lungs are clear to auscultation, no crackles or wheezes.no dullness.   Heart:  Normal  rate and regular rhythm. S1 and S2 normal without gallop, murmur, click, rub or other extra sounds.no lifts.   Abdomen:  Bowel sounds positive,abdomen soft and non-tender without masses, organomegaly or   noted. Pulses:  pulses intact without delay   Extremities:  no clubbing cyanosis or edema  Neurologic:  non focal  orented  no tremor  Skin:  turgor normal and color normal.   Cervical Nodes:  No lymphadenopathy noted Psych:  Oriented X3, normally interactive, good eye contact, not anxious appearing, and not depressed appearing.  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  now problematic with high intensity  responsibilities.    reasonable to try stimulant  meds.    limitations discussed  sib is on vyvanse .     She may require further full psycho ed testing if she needs further accomodations on campus.    try  methylphenidate  LA and then titrate .   .disc with mom and  patient .  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 2 puffs every 4-6 hours as needed  Complete Medication List: 1)  Proair Hfa 108 (90 Base) Mcg/act Aers (Albuterol sulfate) .... 2 puffs every 4-6 hours as needed 2)  Loestrin 24 Fe 1-20 Mg-mcg Tabs (Norethin ace-eth estrad-fe) .Marland Kitchen.. 1 by mouth once daily 3)  Ranitidine Hcl 300 Mg Tabs (Ranitidine hcl) .... Take 1 tablet twice a day 4)  Retin-a Micro 0.1 % Gel (Tretinoin microsphere) .... Use as directed 5)  Methylphenidate Hcl Cr 20 Mg Cr-tabs (Methylphenidate hcl) .Marland Kitchen.. 1 by mouth once daily 6)  Methylphenidate Hcl 10 Mg Tabs (Methylphenidate hcl) .... 1/2 to one in afternoon for homework if needed  Patient Instructions: 1)   can begin ER methylphenidate  2)  can add on immediate release  at end of day for study. 3)  call  or ROV in   3-4 weeks   about progress and plan for meds.  Prescriptions: METHYLPHENIDATE HCL 10 MG TABS (METHYLPHENIDATE HCL) 1/2 to one in afternoon for homework if needed  #30 x 0   Entered and Authorized by:   Madelin Headings MD   Signed by:   Madelin Headings MD on 01/11/2010   Method used:   Print then Give to Patient   RxID:   [id number] METHYLPHENIDATE HCL CR 20 MG CR-TABS (METHYLPHENIDATE HCL) 1 by mouth once daily  #30 x 0   Entered and Authorized by:   Madelin Headings MD    Signed by:   Madelin Headings MD on 01/11/2010   Method used:   Print then Give to Patient   RxID:   [id number]  greater than 50% of visit spent in counseling    25 minutes    wkp

## 2010-05-16 NOTE — Progress Notes (Signed)
  On Call Note, called by Dad, Dr Claudette Head. Daughter coming home from school with congestion, productive cough, min fever. [facility] but sxs are all URI and Bronchitic. Feels bad and would like trmt.  Will use Zpak and Guaifenesin by [facility] and getting MUCOUS RELIEF EXPECTORANT (400mg ), take 11/2 tabs by mouth AM and NOON. Drink lots of fluids anytime taking Guaifenesin.  Call if sxs worsen. Shaune Leeks MD  May 26, 2009 4:13 PM      New/Updated Medications: ZITHROMAX Z-PAK 250 MG TABS (AZITHROMYCIN) as dir Prescriptions: ZITHROMAX Z-PAK 250 MG TABS (AZITHROMYCIN) as dir  #1 pak x 0   Entered and Authorized by:   Shaune Leeks MD   Signed by:   Shaune Leeks MD on 05/26/2009   Method used:   Electronically to        [facility] ([facility]       [address]       [place], [place]  [address]       Ph: [phone]       Fax: [phone]   RxID:   [id number]

## 2010-05-16 NOTE — Letter (Signed)
Summary: [facility]   Imported By: Maryln Gottron 05/02/2010 15:22:31  _____________________________________________________________________  External Attachment:    Type:   Image     Comment:   External Document

## 2010-05-27 ENCOUNTER — Other Ambulatory Visit: Payer: Self-pay

## 2010-05-27 DIAGNOSIS — J321 Chronic frontal sinusitis: Secondary | ICD-10-CM

## 2010-06-20 ENCOUNTER — Other Ambulatory Visit: Payer: Self-pay | Admitting: *Deleted

## 2010-06-20 MED ORDER — METHYLPHENIDATE HCL 10 MG PO TABS: 10.0000 mg | ORAL_TABLET | Freq: Every day | ORAL | Status: DC

## 2010-06-20 MED ORDER — METHYLPHENIDATE HCL ER (CD) 30 MG PO CPCR: 30.0000 mg | ORAL_CAPSULE | ORAL | Status: AC

## 2010-06-20 NOTE — Telephone Encounter (Signed)
Pt needs a refill on methylphenidate 10mg  takes 1/2 to 1 in the afternoon for homework. Pt got 90 on 04/10/10. She also needs a refill on metadate cd 30mg  1 qd #90

## 2010-06-20 NOTE — Telephone Encounter (Signed)
Ok to refill both as above.  Ov before next 90 day refill

## 2010-06-24 ENCOUNTER — Other Ambulatory Visit: Payer: Self-pay

## 2010-06-24 ENCOUNTER — Ambulatory Visit
Admission: RE | Admit: 2010-06-24 | Discharge: 2010-06-24 | Disposition: A | Discharge status: Home / Self Care | Payer: No Typology Code available for payment source | Source: Ambulatory Visit

## 2010-06-24 DIAGNOSIS — J321 Chronic frontal sinusitis: Secondary | ICD-10-CM

## 2010-06-25 ENCOUNTER — Other Ambulatory Visit: Payer: Self-pay

## 2010-08-30 NOTE — Assessment & Plan Note (Signed)
[facility]                                   ON-CALL NOTE   KAITELYN, JAMISON                        MRN:          [id number]  DATE:11/26/2005                            DOB:          [birth year]    TELEPHONE TRIAGE NOTE   Time received 5:37 p.m.  Caller was Camelia Eng, her mother.  She sees Dr. Tawanna Cooler.  Telephone number [phone].   The patient has had 5 days of severe sore throat, pus pockets on her tonsils  and swollen lymph nodes in her neck.  There has been no fever, no headache  and no cough.  She saw Dr. Tawanna Cooler several days ago and a strep throat was  suspected since she has a history of frequent strep throats in the past.  She was placed on Nasonex nasal sprays, Motrin for discomfort and Keflex 500  mg b.i.d.  She now has the same symptoms and is no better at all.  Her  mother is concerned because she is to fly to [place] tomorrow morning for 4-  5 days to be with friends.  Of note, the patient did have mononucleosis  several years ago.  She has no known allergies.  In my discussion with the  mother, I told her that the fact that she was not responding to the Keflex  probably indicated that this was a viral infection rather than a bacterial  one and she understands this.  However, since she is leaving town we decided  to provide better antibiotic coverage.  Will stop Keflex and begin Avelox  400 mg once a day x10 days.  I did call this in to [facility] at [phone].  She will continue fluids and Motrin.  If she gets any worse, I did advise  her mother that she should be seen by someone either here or when she gets  to [place].                                   Tera Mater. Clent Ridges, MD   SAF/MedQ  DD:  11/27/2005  DT:  11/27/2005  Job #:  [id number]

## 2010-10-24 ENCOUNTER — Encounter: Payer: Self-pay | Admitting: Internal Medicine

## 2010-10-30 ENCOUNTER — Ambulatory Visit (INDEPENDENT_AMBULATORY_CARE_PROVIDER_SITE_OTHER): Payer: No Typology Code available for payment source | Admitting: Internal Medicine

## 2010-10-30 VITALS — BP 120/80 | HR 72 | Ht 70.0 in | Wt 144.0 lb

## 2010-10-30 DIAGNOSIS — N946 Dysmenorrhea, unspecified: Secondary | ICD-10-CM

## 2010-10-30 DIAGNOSIS — Z0000 Encounter for general adult medical examination without abnormal findings: Secondary | ICD-10-CM

## 2010-10-30 DIAGNOSIS — L709 Acne, unspecified: Secondary | ICD-10-CM

## 2010-10-30 DIAGNOSIS — J45990 Exercise induced bronchospasm: Secondary | ICD-10-CM

## 2010-10-30 MED ORDER — AMPHETAMINE-DEXTROAMPHET ER 20 MG PO CP24: 20.0000 mg | ORAL_CAPSULE | ORAL | Status: DC

## 2010-10-30 MED ORDER — ALBUTEROL SULFATE HFA 108 (90 BASE) MCG/ACT IN AERS: 2.0000 | INHALATION_SPRAY | Freq: Four times a day (QID) | RESPIRATORY_TRACT | Status: DC | PRN

## 2010-10-30 MED ORDER — AMPHETAMINE-DEXTROAMPHETAMINE 10 MG PO TABS: ORAL_TABLET | ORAL | Status: DC

## 2010-10-30 MED ORDER — RANITIDINE HCL 300 MG PO TABS: 300.0000 mg | ORAL_TABLET | Freq: Two times a day (BID) | ORAL | Status: DC

## 2010-10-30 MED ORDER — NORETHIN ACE-ETH ESTRAD-FE 1-20 MG-MCG(24) PO TABS: 1.0000 | ORAL_TABLET | ORAL | Status: DC

## 2010-10-30 NOTE — Progress Notes (Signed)
Subjective:    Patient ID: Amanda Garrison, female    DOB: 1990/10/03, 20 y.o.   MRN: [id number]  HPI Comes in today for med evaluation and changes and also Wellness visit. She is in summer school at [employer]  Has excellent grades.  No major change in health status since last visit . Update  Problems GERD  Ranitidine bid is quite helpful Asthma  : uses  b agonist   Prn pre exercise.  Stable controlled. Dysmenorrhea and acne   Good on ocps  And like the Loestrin 24   Needs refill No abd pain dysuria and vaginal sx . monog relationship .  Acne  Topical help but insurance may not pay cause of her age.  ADD   Ritalin helps some but not enough coverage and optimum.   Would like tory other med again.  ( choices were maid cause of drug shortage previously)    Review of Systems ROS:  GEN/ HEENTNo fever, significant weight changes sweats headaches vision problems hearing changes, CV/ PULM; No chest pain shortness of breath cough, syncope,edema  change in exercise tolerance. GI /GU: No adominal pain, vomiting, change in bowel habits. No blood in the stool. No significant GU symptoms. SKIN/HEME: ,no acute skin rashes suspicious lesions or bleeding. No lymphadenopathy, nodules, masses.  NEURO/ PSYCH:  No neurologic signs such as weakness numbness No depression anxiety. IMM/ Allergy: No unusual infections.  Allergy .   REST of 12 system review negative see above  Hpi.  Past history family history social history reviewed in the electronic medical record.     Objective:   Physical Exam Physical Exam: Vital signs reviewed NWG:NFAO is a well-developed well-nourished alert cooperative  white female who appears her stated age in no acute distress.  HEENT: normocephalic  traumatic , Eyes: PERRL EOM's full, conjunctiva clear, Nares: paten,t no deformity discharge or tenderness., Ears: no deformity EAC's clear TMs with normal landmarks. Mouth: clear OP, no lesions, edema.  Moist mucous membranes. Dentition in  adequate repair. NECK: supple without masses, thyromegaly or bruits. CHEST/PULM:  Clear to auscultation and percussion breath sounds equal no wheeze , rales or rhonchi. No chest wall deformities or tenderness. CV: PMI is nondisplaced, S1 S2 no gallops, murmurs, rubs. Peripheral pulses are full without delay.No JVD .  Breast: normal by inspection . No dimpling, discharge, masses, tenderness or discharge . LN: no cervical axillary inguinal adenopathy ABDOMEN: Bowel sounds normal nontender  No guard or rebound, no hepato splenomegal no CVA tenderness.  No hernia. Extremtities:  No clubbing cyanosis or edema, no acute joint swelling or redness no focal atrophy NEURO:  Oriented x3, cranial nerves 3-12 appear to be intact, no obvious focal weakness,gait within normal limits no abnormal reflexes or asymmetrical SKIN: No acute rashes normal turgor, color, no bruising or petechiae.  Few acne areas.  No scarring. PSYCH: Oriented, good eye contact, no obvious depression anxiety, cognition and judgment appear normal. Screening ortho / MS exam: normal;  No scoliosis ,LOM , joint swelling or gait disturbance . Muscle mass is normal .  Had labs this year elsewhere  And normal     Assessment & Plan:  Preventive Health Care Counseled regarding healthy nutrition, exercise, sleep, injury prevention, calcium vit d and healthy weight .    Stable  GERD  Continue meds  Acne  Continue  Dysmenorrhea and contraception   Can get screened on campus as appropriate Continue. ADD   Reasonable to change meds and try adderall xr daily lower  dose and seen how does and add on IR  If needed for [facility] and other time   90 days given  xr  50 pills of ir.

## 2010-10-30 NOTE — Patient Instructions (Signed)
Change meds as we discussed Continue lifestyle intervention healthy eating and exercise . Med check in about 6 months or call for  Med advice.

## 2010-11-02 ENCOUNTER — Encounter: Payer: Self-pay | Admitting: Internal Medicine

## 2010-11-02 DIAGNOSIS — Z0000 Encounter for general adult medical examination without abnormal findings: Secondary | ICD-10-CM | POA: Insufficient documentation

## 2010-11-02 DIAGNOSIS — F988 Other specified behavioral and emotional disorders with onset usually occurring in childhood and adolescence: Secondary | ICD-10-CM | POA: Insufficient documentation

## 2010-11-02 DIAGNOSIS — L709 Acne, unspecified: Secondary | ICD-10-CM | POA: Insufficient documentation

## 2010-11-02 DIAGNOSIS — J45990 Exercise induced bronchospasm: Secondary | ICD-10-CM | POA: Insufficient documentation

## 2011-03-04 ENCOUNTER — Telehealth: Payer: Self-pay | Admitting: *Deleted

## 2011-03-04 MED ORDER — AMPHETAMINE-DEXTROAMPHETAMINE 10 MG PO TABS: ORAL_TABLET | ORAL | Status: DC

## 2011-03-04 NOTE — Telephone Encounter (Signed)
Mom aware rx will be ready

## 2011-03-04 NOTE — Telephone Encounter (Signed)
Needs short acting adderall.

## 2011-03-08 ENCOUNTER — Telehealth: Payer: Self-pay | Admitting: Family Medicine

## 2011-03-08 MED ORDER — AMOXICILLIN-POT CLAVULANATE ER 1000-62.5 MG PO TB12: 1.0000 | ORAL_TABLET | Freq: Two times a day (BID) | ORAL | Status: AC

## 2011-03-08 NOTE — Telephone Encounter (Signed)
Mother called requesting Augmentin 1000 mg twice daily for 10 days. Both parents are physicians and she has had chronic sinusitis, previously followed by Dr. Christella Hartigan.  Mom called Dr. Christella Hartigan this morning, she is no longer in practice but advised that she be placed on Augmentin since she is now developing acute symptoms.  [facility] informed pt's mom that we do not routinely call in abx without seeing patients.  I spoke with mom and agreed it would be ok to send it in under these circumstances. Rx sent to [facility] on [address].

## 2011-03-10 ENCOUNTER — Telehealth: Payer: Self-pay | Admitting: Family Medicine

## 2011-03-10 NOTE — Telephone Encounter (Signed)
Triage Record Num: [id number] Operator: Di Kindle Patient Name: Amanda Garrison Call Date & Time: 03/08/2011 3:01:49PM Patient Phone: [phone] PCP: [name]. [name] Patient Gender: Female PCP Fax : [phone] Patient DOB: 1990/08/10 [facility] Name: [facility] Reason for Call: Caller: Terry/Mother; PCP: Madelin Headings.; CB#: [phone]; Call Reason: Chronic Frontal Sinus Infect.; Sx Onset: 03/01/2011; Sx Notes: referral 3/11 to ENT, took Augmentin BID for three months (no longer in practice) onset thick green drainage, frontal headache. Afebrile;; Home treatment(s) tried: Antihistamine ; Did home treatment help?: No; Guideline Used: URI ; Disp: ; Appt Scheduled?: N college student, mom is requesting Augmentin 1000 mg BID for 10 days. Dr Dayton Martes advises unable to call in RX ([facility] [address] [phone].) MOM is insisting to speak with MD. Call to Dr Dayton Martes, states with not being an emergent, she will speak with mom now. Protocol(s) Used: Upper Respiratory Infection (URI) Recommended Outcome per Protocol: See Provider within 72 Hours Reason for Outcome: Generalized mild frontal headache unresponsive to home care measures Care Advice: ~ Use a cool mist humidifier to moisten air. Be sure to clean according to manufacturer's instructions. Call provider if headache worsens, develops temperature greater than 101.38F (38.1C) or any temperature elevation in a geriatric or immunocompromised patient (such as diabetes, HIV/AIDS, chemotherapy, organ transplant, or chronic steroid use). ~ ~ Consider use of a saline nasal spray per package directions to help relieve nasal congestion. A warm, moist compress placed on face, over eyes for 15 to 20 minutes, 5 to 6 times a day, may help relieve the congestion. ~ Most adults need to drink 6-10 eight-ounce glasses (1.2-2.0 liters) of fluids per day unless previously told to limit fluid intake for other medical reasons. Limit fluids that contain  caffeine, sugar or alcohol. Urine will be a very light yellow color when you drink enough fluids. ~

## 2011-03-11 ENCOUNTER — Telehealth: Payer: Self-pay | Admitting: Internal Medicine

## 2011-03-11 NOTE — Telephone Encounter (Signed)
Patient Name: Amanda Garrison Call Date & Time: 03/09/2011 1:17:09PM Patient Phone: [phone] PCP: [name]. [name] Patient Gender: Female PCP Fax : [phone] Patient DOB: 03-08-1991 Practice Name: [facility] Reason for Call: Marcelino Duster is calling from [facility] [phone]. Aug Xr 1000/62.5 was ordered by Dr Dayton Martes yesterday. Pt insists it be changed to Aug 500/50 x 2 po bid instead. Dr Dayton Martes consulted. She states okay to change it as pt requests. Protocol(s) Used: Office Note Recommended Outcome per Protocol: Information Noted and Sent to Office Reason for Outcome: Caller information to office

## 2011-04-14 ENCOUNTER — Telehealth: Payer: Self-pay

## 2011-04-14 NOTE — Telephone Encounter (Signed)
FYI:  Pt has an appt on 04/16/11 at 12:30 for a med follow up.  Pt thinks she may have a uti. Pt advised that there were no cancellations today.  Called pt denies fever, or blood in urine. Pt states she will keep appt on 04/16/11.  Pt states she is not as bad as she thought and she has some cranberry juice to drink until appt.

## 2011-04-16 ENCOUNTER — Ambulatory Visit (INDEPENDENT_AMBULATORY_CARE_PROVIDER_SITE_OTHER): Payer: 59 | Admitting: Internal Medicine

## 2011-04-16 ENCOUNTER — Encounter: Payer: Self-pay | Admitting: Internal Medicine

## 2011-04-16 DIAGNOSIS — Z23 Encounter for immunization: Secondary | ICD-10-CM | POA: Insufficient documentation

## 2011-04-16 DIAGNOSIS — F988 Other specified behavioral and emotional disorders with onset usually occurring in childhood and adolescence: Secondary | ICD-10-CM

## 2011-04-16 DIAGNOSIS — R300 Dysuria: Secondary | ICD-10-CM

## 2011-04-16 LAB — POCT URINALYSIS DIPSTICK
Blood, UA: NEGATIVE
Nitrite, UA: NEGATIVE
Protein, UA: NEGATIVE
Urobilinogen, UA: 0.2
pH, UA: 6

## 2011-04-16 MED ORDER — AMPHETAMINE-DEXTROAMPHETAMINE 10 MG PO TABS: ORAL_TABLET | ORAL | Status: DC

## 2011-04-16 MED ORDER — CIPROFLOXACIN HCL 500 MG PO TABS: 500.0000 mg | ORAL_TABLET | Freq: Two times a day (BID) | ORAL | Status: DC

## 2011-04-16 MED ORDER — AMPHETAMINE-DEXTROAMPHET ER 20 MG PO CP24: 20.0000 mg | ORAL_CAPSULE | ORAL | Status: DC

## 2011-04-16 NOTE — Patient Instructions (Signed)
Continue same meds  Will notify you  of labs when available. Will begin antibiotic  For prob uti today

## 2011-04-16 NOTE — Progress Notes (Signed)
  Subjective:    Patient ID: Amanda Garrison, female    DOB: Feb 22, 1991, 21 y.o.   MRN: [id number]  HPI Patient comes in today for follow up of   medical  Management and new problem.  UTi sx for a while using  Pyridium and   Cranberry  See phone note has had utis in the past but not in last 6 months last sti screen  3-4 months ago. No vag sx no fever NVD.  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  Lives in house  Apts.  Habits  caffiene   Increasing  .   Energy drinks. In am but limits  ocaass  Etoh.  Periods ok  ADHD: med works well and using xrmost days and adds o 5 mg 1-2 x  For hw or study.   Review of Systems Neg cp sob  Gi gu except as per hpi  No unusual bleeding had painful period  last time.   Past history family history social history reviewed in the electronic medical record.    [facility] doing well       Objective:   Physical Exam WDWN in nad  Chest:  Clear to A&P without wheezes rales or rhonchi CV:  S1-S2 no gallops or murmurs peripheral perfusion is normal Neck: Supple without adenopathy or masses or bruits Abdomen:  Sof,t normal bowel sounds without hepatosplenomegaly, no guarding rebound or masses no CVA tenderness  No clubbing cyanosis or edema UA leuk     Assessment & Plan:  Adhd 21 xr in am and  Add on  5  And 5  adderall    Helping  This .   No sig se  At this point  Addressing  appetite. Grades good and  Feeling well with this. UtI : hx of same   cx today    Neg sti screen in the past.   No vag sx  Sent for  cx and ch screen  Ok to give  Results  Ok to give to mom when back. Wellness in 6 months  Flu shot today.

## 2011-04-18 ENCOUNTER — Telehealth: Payer: Self-pay | Admitting: *Deleted

## 2011-04-18 MED ORDER — CIPROFLOXACIN HCL 500 MG PO TABS: 500.0000 mg | ORAL_TABLET | Freq: Two times a day (BID) | ORAL | Status: AC

## 2011-04-18 MED ORDER — CEPHALEXIN 500 MG PO CAPS: 500.0000 mg | ORAL_CAPSULE | Freq: Two times a day (BID) | ORAL | Status: DC

## 2011-04-18 NOTE — Telephone Encounter (Signed)
Mom called stating that UTI symptoms are gone but the LF flank pain is still there. It is not relieve with pressure or heat. Taking motrin for pain. She has back pain gets worse after urinating. Mom is worried that it could be pylenephritis. Pt is in [place] right now. No fever, no vomiting.

## 2011-04-18 NOTE — Telephone Encounter (Signed)
Per Dr. Fabian Sharp- Do Cipro for 10 days and can add keflex 500mg  1 bid x 10 days. Rx sent to pharmacy. Mom aware of this.

## 2011-04-21 ENCOUNTER — Encounter: Payer: Self-pay | Admitting: Internal Medicine

## 2011-04-21 ENCOUNTER — Ambulatory Visit (INDEPENDENT_AMBULATORY_CARE_PROVIDER_SITE_OTHER): Payer: 59 | Admitting: Internal Medicine

## 2011-04-21 ENCOUNTER — Ambulatory Visit (HOSPITAL_COMMUNITY)
Admission: RE | Admit: 2011-04-21 | Discharge: 2011-04-21 | Disposition: A | Discharge status: Home / Self Care | Payer: 59 | Source: Ambulatory Visit | Attending: Internal Medicine | Admitting: Internal Medicine

## 2011-04-21 ENCOUNTER — Other Ambulatory Visit: Payer: Self-pay | Admitting: Internal Medicine

## 2011-04-21 VITALS — BP 120/80 | HR 78 | Wt 144.0 lb

## 2011-04-21 DIAGNOSIS — N12 Tubulo-interstitial nephritis, not specified as acute or chronic: Secondary | ICD-10-CM | POA: Insufficient documentation

## 2011-04-21 DIAGNOSIS — R300 Dysuria: Secondary | ICD-10-CM

## 2011-04-21 DIAGNOSIS — K5900 Constipation, unspecified: Secondary | ICD-10-CM | POA: Insufficient documentation

## 2011-04-21 DIAGNOSIS — R109 Unspecified abdominal pain: Secondary | ICD-10-CM

## 2011-04-21 DIAGNOSIS — R112 Nausea with vomiting, unspecified: Secondary | ICD-10-CM | POA: Insufficient documentation

## 2011-04-21 LAB — POCT URINALYSIS DIPSTICK
Glucose, UA: NEGATIVE
Ketones, UA: NEGATIVE
Spec Grav, UA: <=1.005

## 2011-04-21 MED ORDER — CEFTRIAXONE SODIUM 1 G IJ SOLR
1.0000 g | Freq: Once | INTRAMUSCULAR | Status: AC
  Administered 2011-04-21: 1 g via INTRAMUSCULAR

## 2011-04-21 MED ORDER — HYDROCODONE-ACETAMINOPHEN 7.5-500 MG PO TABS: 1.0000 | ORAL_TABLET | Freq: Four times a day (QID) | ORAL | Status: AC | PRN

## 2011-04-21 MED ORDER — CEPHALEXIN 500 MG PO CAPS: 500.0000 mg | ORAL_CAPSULE | Freq: Three times a day (TID) | ORAL | Status: AC

## 2011-04-21 NOTE — Progress Notes (Signed)
  Subjective:    Patient ID: Amanda Garrison, female    DOB: 1990/11/17, 20 y.o.   MRN: [id number]  HPI Patient comes in today as an acute work in for her urinary tract infection and continued left flank pain with urination. See phone notes. Since last visit her UTI symptoms are a lot better but she has continued left flank pain without associated fever although she is taking meds for discomfort. Took a half of the lower set because of pain in the middle of the night The father called this morning about further workup. We discussed possibility of abdominal ultrasound and CT scan as per my phone message. Momcalled his urologist in the meantime and discuss CT scan.   She is coming in after her CT scan that was done this morning. She has had no fever but has been on medicine and has had some nausea throughout one of the Keflex. Doesn't feel well overall but not worse.  No fever and chills but has felt clammy at times.   Review of Systems No fever diarrhea some nausea. To go back to school in 2 days.  Past history family history social history reviewed in the electronic medical record.     Objective:   Physical Exam wdwn in   nad  looks a bit puny nontoxic prefers to lay down Neck: Supple without adenopathy or masses or bruits Chest:  Clear to A&P without wheezes rales or rhonchi CV:  S1-S2 no gallops or murmurs peripheral perfusion is normal Locates discomfort to left flank area but no tenderness now Abdomen:  Sof,t normal bowel sounds without hepatosplenomegaly, no guarding rebound or masses no CVA tenderness Neg psoas sign   No clubbing cyanosis or edema  Reviewed CT scan which showed no stone obstruction or renal abnormality. There was findings consistent with constipation. Repeat UA is pending.  Urine culture reviewed with patient and mother. Handout given not aureus staph greater than 100,000 sensitive to all medicines tested except for straight penicillin.       Assessment & Plan:   UTI with persistent left flank pain. No evidence of stone or abscess on her CT scan.   Overall her lower symptoms are significantly improved but her nocturnal left flank pain seems a bit severe. Some of her GI symptoms could be related to side effects of medication. She does have constipation on her CT scan which could be from her pain medication.  At this point in time we can go ahead and give her gram of Rocephin rest her stomach for a day pain medicine with care risk benefit noted. Continue hydration expect her pain to be better in the next 48 hours or so. We'll switch over to plain Keflex 500 3 times a day for a full course of 10 days.  Hopefully this will mitigate any side effect of medicine given.  Call and leave a message about how she is doing and followup depending on status.    Constipation secondary probably discussed options MiraLax stool softener etc. Copy of scan and cx given to pt andmom.

## 2011-04-21 NOTE — Progress Notes (Signed)
Quick Note:  See phone note ______ 

## 2011-04-21 NOTE — Patient Instructions (Addendum)
miralax  Increase fruits fiber.   Constipation could be adding to the mix of discomfort.  Keflex 500 tid for 8 more days beginning tomorrow.  Care with narcotic med causing constipation.

## 2011-04-22 NOTE — Progress Notes (Signed)
Quick Note:  Mom aware of results. ______ 

## 2011-04-25 ENCOUNTER — Telehealth: Payer: Self-pay | Admitting: *Deleted

## 2011-04-25 NOTE — Telephone Encounter (Signed)
Pt is feeling better- pain is much less. Pt needs a note for school for Wed and thurs. She went back today. Mom aware that note is up front.

## 2011-07-22 ENCOUNTER — Other Ambulatory Visit: Payer: Self-pay

## 2011-07-22 MED ORDER — AMPHETAMINE-DEXTROAMPHET ER 20 MG PO CP24: 20.0000 mg | ORAL_CAPSULE | ORAL | Status: DC

## 2011-07-22 MED ORDER — AMPHETAMINE-DEXTROAMPHETAMINE 10 MG PO TABS: ORAL_TABLET | ORAL | Status: DC

## 2011-07-22 NOTE — Telephone Encounter (Signed)
Pt's mother aware rx ready for pickup. 

## 2011-07-22 NOTE — Telephone Encounter (Signed)
Ok to refill 90 days for long acting and refill x 1 IR.

## 2011-07-22 NOTE — Telephone Encounter (Signed)
Pt's mother called and states that pt called from school and states she needs her medication especially since she is going to do summer school.  Pt needs both the short and long acting medication.  Pt last seen 04/21/11. Both rx last filled 04/16/11.   Pls advise.

## 2011-09-25 ENCOUNTER — Ambulatory Visit (INDEPENDENT_AMBULATORY_CARE_PROVIDER_SITE_OTHER): Payer: 59 | Admitting: Internal Medicine

## 2011-09-25 ENCOUNTER — Encounter: Payer: Self-pay | Admitting: Internal Medicine

## 2011-09-25 VITALS — BP 126/88 | HR 103 | Temp 98.6°F | Wt 139.0 lb

## 2011-09-25 DIAGNOSIS — J019 Acute sinusitis, unspecified: Secondary | ICD-10-CM | POA: Insufficient documentation

## 2011-09-25 DIAGNOSIS — R51 Headache: Secondary | ICD-10-CM

## 2011-09-25 DIAGNOSIS — R5383 Other fatigue: Secondary | ICD-10-CM | POA: Insufficient documentation

## 2011-09-25 DIAGNOSIS — Z8709 Personal history of other diseases of the respiratory system: Secondary | ICD-10-CM | POA: Insufficient documentation

## 2011-09-25 MED ORDER — AMOXICILLIN-POT CLAVULANATE ER 1000-62.5 MG PO TB12: 2.0000 | ORAL_TABLET | Freq: Two times a day (BID) | ORAL | Status: AC

## 2011-09-25 MED ORDER — PREDNISONE 20 MG PO TABS: ORAL_TABLET | ORAL | Status: AC

## 2011-09-25 NOTE — Patient Instructions (Signed)
Minimize sedating antihistamines   Add antibiotic  And nasal cortisone every day  If not sig improved in 48- 72 hours add the prednisone as we discussed.  May benefit from some type of nasal steroid when gets congested.

## 2011-09-25 NOTE — Progress Notes (Signed)
  Subjective:    Patient ID: Amanda Garrison, female    DOB: April 23, 1990, 20 y.o.   MRN: [id number]  HPI Patient comes in today for SDA for  new problem evaluation. She has been very busy in [employer] classes and had the onset of the above problems since about May 20 th .  Had cold  And got better with decongestants still tired but still contining waxing and waning sinus pain and taking advil but still stuffy and antihistamine .  Benadryl and Claritin with erratic help  Tired and  stilll ongoing face pain headaches for weeks. advil around the clock with some help and benadryl  And zyrtec  Yesterday .   Stopped the decongestant. No recent fever.  Constant  Pressure  in the 4 head and ethmoid area [place]  Some better with rest .   Pain"inside eye balls. "  Remote hx of  fromtal sinus infection 5-6 months again.   Not as bad as that.  Classess 2 nd semester soon.  No ets . Exercise sports science ; Psychology.  Cognitive  Science.   Review of Systems Negative for fever chills nausea vomiting unusual rashes asthma flare or new GI GU problem. She does have fatigue some sleep disturbance at times.  Past history family history social history reviewed in the electronic medical record.     Objective:   Physical Exam BP 126/88  Pulse 103  Temp 98.6 F (37 C) (Oral)  Wt 139 lb (63.05 kg)  SpO2 98%  LMP 09/11/2011 Mildly tired appearing nontoxic in no acute distress. HEENT normocephalic TMs intact normal landmarks eyes clear EOMs full. OP posterior pharyngeal redness but no PND seen. Nares slight mucoid discharge on the right maxillary area nontender some tenderness in the bifrontal area ethmoid. Neck supple without significant adenopathy Chest clear to auscultations cardiac S1-S2 no gallops or murmurs No clubbing cyanosis or edema    Assessment & Plan:  Prolonged ur  With frontal pain   Past history of frontal sinusitis. Fatigue could be from the prolonged illness school  work and oriented his to me. Plan would be to add nasal cortisone she has had some sores in her nose in the past on some sample given of Zettonna and Nasonex Also high-dose Augmentin  and if not improving in the next few days can add 5 days of prednisone.  Followup or contact us if persistent or progressive without improvement. Nasal

## 2011-11-03 ENCOUNTER — Telehealth: Payer: Self-pay | Admitting: Family Medicine

## 2011-11-03 MED ORDER — POLYMYXIN B-TRIMETHOPRIM 10000-0.1 UNIT/ML-% OP SOLN: 1.0000 [drp] | OPHTHALMIC | Status: AC

## 2011-11-03 NOTE — Telephone Encounter (Signed)
Rubin Payor (pt's mom) called to notify us that she believes the pt's woke this morning with pink eye.  She informed me that the pt's left eye is itchy, red and swollen.  The eye had white "crust" this morning as well.  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  Aurther Loft is requesting medication be sent to [facility] on the [address] of [address].  Please advise.  Thanks!!!

## 2011-11-08 ENCOUNTER — Ambulatory Visit (INDEPENDENT_AMBULATORY_CARE_PROVIDER_SITE_OTHER): Payer: 59 | Admitting: Family Medicine

## 2011-11-08 ENCOUNTER — Encounter: Payer: Self-pay | Admitting: Family Medicine

## 2011-11-08 VITALS — BP 130/80 | HR 99 | Temp 99.7°F | Ht 70.0 in | Wt 146.0 lb

## 2011-11-08 DIAGNOSIS — B09 Unspecified viral infection characterized by skin and mucous membrane lesions: Secondary | ICD-10-CM

## 2011-11-08 DIAGNOSIS — J45990 Exercise induced bronchospasm: Secondary | ICD-10-CM

## 2011-11-08 DIAGNOSIS — J029 Acute pharyngitis, unspecified: Secondary | ICD-10-CM

## 2011-11-08 MED ORDER — METHYLPREDNISOLONE 4 MG PO KIT: PACK | ORAL | Status: AC

## 2011-11-08 MED ORDER — AMOXICILLIN-POT CLAVULANATE 875-125 MG PO TABS: 1.0000 | ORAL_TABLET | Freq: Two times a day (BID) | ORAL | Status: AC

## 2011-11-08 MED ORDER — HYDROCOD POLST-CHLORPHEN POLST 10-8 MG/5ML PO LQCR: 5.0000 mL | Freq: Two times a day (BID) | ORAL | Status: DC | PRN

## 2011-11-08 MED ORDER — AMOXICILLIN-POT CLAVULANATE 875-125 MG PO TABS: 1.0000 | ORAL_TABLET | Freq: Two times a day (BID) | ORAL | Status: DC

## 2011-11-08 MED ORDER — CETIRIZINE HCL 10 MG PO TABS: 10.0000 mg | ORAL_TABLET | Freq: Two times a day (BID) | ORAL | Status: DC

## 2011-11-08 MED ORDER — ALIGN PO CAPS: 1.0000 | ORAL_CAPSULE | Freq: Every day | ORAL | Status: DC

## 2011-11-08 NOTE — Patient Instructions (Addendum)

## 2011-11-09 ENCOUNTER — Encounter: Payer: Self-pay | Admitting: Family Medicine

## 2011-11-09 DIAGNOSIS — J029 Acute pharyngitis, unspecified: Secondary | ICD-10-CM | POA: Insufficient documentation

## 2011-11-09 HISTORY — DX: Acute pharyngitis, unspecified: J02.9

## 2011-11-09 NOTE — Assessment & Plan Note (Signed)
No flare with this acute illness

## 2011-11-09 NOTE — Progress Notes (Signed)
Amanda Garrison [id number] Jul 17, 1990 11/09/2011      Progress Note-Follow Up  Subjective  Chief Complaint  Chief Complaint  Patient presents with  . Sore Throat    congestion, ST, rash, HAs, cough x 5 days  . Rash    HPI  This is a 21 year old Caucasian female who is in today accompanied by her mother just a long history of recurrent sinusitis and has been struggling with poor health really since her college finals earlier this spring. She became notably worse about 5 days ago. Notes increased facial pressure pain and headaches. Increased nasal congestion cough and fatigue. Also notes a sore throat but denies chest pain, palpitations or shortness of breath. Does have exercise-induced asthma but that has not recently worsened. Historically Augmentin has been helpful for her. She also notes a significant rash during this illness. The rash has been urticarial in nature and they bring in pictures from earlier in the week which show large raised red blotches on her arms. The lesions now have progressed and have some central clearing on her arms she also has smaller lesions scattered across her abdomen and legs.  Past Medical History  Diagnosis Date  . Exercise-induced asthma   . GERD (gastroesophageal reflux disease)     does well on ranitidine endo 2010  . Exercise-induced asthma   . Acne   . Attention deficit     [facility]  psychology in past  . Hx of varicella   . History of infectious mononucleosis 10/06  . Pharyngitis 11/09/2011    With acute on history of chronic sinusitis    Past Surgical History  Procedure Date  . Myringotomy 97 and 98    History reviewed. No pertinent family history.  History   Social History  . Marital Status: Single    Spouse Name: N/A    Number of Children: N/A  . Years of Education: N/A   Occupational History  . Not on file.   Social History Main Topics  . Smoking status: Never Smoker   . Smokeless tobacco: Not on file  . Alcohol Use: Not  on file  . Drug Use: Not on file  . Sexually Active: Not on file   Other Topics Concern  . Not on file   Social History Narrative   [employer]  Good grades minor in psychology major exercise sports physiology seniorNOn smoker     Current Outpatient Prescriptions on File Prior to Visit  Medication Sig Dispense Refill  . albuterol (PROAIR HFA) 108 (90 BASE) MCG/ACT inhaler Inhale 2 puffs into the lungs every 6 (six) hours as needed.  1 Inhaler  3  . amphetamine-dextroamphetamine (ADDERALL XR, 20MG ,) 20 MG 24 hr capsule Take 1 capsule (20 mg total) by mouth every morning.  90 capsule  0  . amphetamine-dextroamphetamine (ADDERALL, 10MG ,) 10 MG tablet 1/2 -1 po add on for homework  50 tablet  0  . cetirizine (ZYRTEC) 10 MG tablet Take 1 tablet (10 mg total) by mouth 2 (two) times daily.  30 tablet  11  . Norethin Ace-Eth Estrad-FE (LOESTRIN 24 FE) 1-20 MG-MCG(24) TABS Take 1 tablet by mouth 1 day or 1 dose.  84 each  3  . ranitidine (ZANTAC) 300 MG tablet Take 1 tablet (300 mg total) by mouth 2 (two) times daily.  180 tablet  3  . trimethoprim-polymyxin b (POLYTRIM) ophthalmic solution Place 1 drop into the left eye every 4 (four) hours.  10 mL  0  No Known Allergies  Review of Systems  Review of Systems  Constitutional: Positive for malaise/fatigue. Negative for fever and chills.  HENT: Positive for congestion and sore throat.   Eyes: Negative for discharge.  Respiratory: Positive for cough. Negative for shortness of breath.   Cardiovascular: Negative for chest pain, palpitations and leg swelling.  Gastrointestinal: Negative for nausea, abdominal pain and diarrhea.  Genitourinary: Negative for dysuria.  Musculoskeletal: Positive for myalgias. Negative for falls.  Skin: Positive for rash.  Neurological: Positive for headaches. Negative for loss of consciousness.  Endo/Heme/Allergies: Negative for polydipsia.  Psychiatric/Behavioral: Negative for depression and suicidal ideas. The  patient is not nervous/anxious and does not have insomnia.     Objective  BP 130/80  Pulse 99  Temp 99.7 F (37.6 C) (Oral)  Ht 5\' 10"  (1.778 m)  Wt 146 lb (66.225 kg)  BMI 20.95 kg/m2  SpO2 94%  LMP 10/25/2011  Physical Exam  Physical Exam  Constitutional: She is oriented to person, place, and time and well-developed, well-nourished, and in no distress. No distress.  HENT:  Head: Normocephalic and atraumatic.       Oropharynx erythematous and mildly edematous. Nasal mucosa boggy and erythematous  Eyes: Conjunctivae are normal.  Neck: Neck supple. No thyromegaly present.  Cardiovascular: Normal rate, regular rhythm and normal heart sounds.   No murmur heard. Pulmonary/Chest: Effort normal and breath sounds normal. She has no wheezes.  Abdominal: She exhibits no distension and no mass.  Musculoskeletal: She exhibits no edema.  Lymphadenopathy:    She has cervical adenopathy.  Neurological: She is alert and oriented to person, place, and time.  Skin: Skin is warm and dry. Rash noted. She is not diaphoretic. There is erythema.       Diffuse rash most prominent on arms but also noted on abdomen and legs. Rash is erythematous in circular patches with some central clearing in some lesions. Some lesions 1 cm in diameter and others as large as 3 cm in diameter. Slightly raised  Psychiatric: Memory, affect and judgment normal.    No results found for this basename: TSH   Lab Results  Component Value Date   HGB 12.0 06/22/2009    Assessment & Plan  Exercise-induced asthma No flare with this acute illness  Pharyngitis Has been treated repeatedly over the years for sinusitis. Struggled with symptoms during and after college finals this year and has now had worsening symptoms over past 5-7 days. She has had an evolving rash as well as congestion, sore throat, fatigue and increased sinus pressure. She is started on Augmentin bid, medrol dosepak and probiotics and asked to follow up  with PMD if symptoms worsen or do not improve.

## 2011-11-09 NOTE — Assessment & Plan Note (Signed)
Has been treated repeatedly over the years for sinusitis. Struggled with symptoms during and after college finals this year and has now had worsening symptoms over past 5-7 days. She has had an evolving rash as well as congestion, sore throat, fatigue and increased sinus pressure. She is started on Augmentin bid, medrol dosepak and probiotics and asked to follow up with PMD if symptoms worsen or do not improve.

## 2011-11-21 ENCOUNTER — Telehealth: Payer: Self-pay | Admitting: Internal Medicine

## 2011-11-21 ENCOUNTER — Ambulatory Visit (INDEPENDENT_AMBULATORY_CARE_PROVIDER_SITE_OTHER): Payer: 59 | Admitting: Family Medicine

## 2011-11-21 ENCOUNTER — Encounter: Payer: Self-pay | Admitting: Family Medicine

## 2011-11-21 VITALS — BP 120/78 | Temp 98.7°F | Wt 147.0 lb

## 2011-11-21 DIAGNOSIS — R21 Rash and other nonspecific skin eruption: Secondary | ICD-10-CM

## 2011-11-21 DIAGNOSIS — J40 Bronchitis, not specified as acute or chronic: Secondary | ICD-10-CM

## 2011-11-21 MED ORDER — PREDNISONE 10 MG PO TABS: ORAL_TABLET | ORAL | Status: DC

## 2011-11-21 NOTE — Progress Notes (Signed)
  Subjective:    Patient ID: Amanda Garrison, female    DOB: 1991-02-28, 21 y.o.   MRN: [id number]  HPI Here with mother for continued symptoms of sinus pressure, PND, chest tightness, a dry cough, and rash. She was seen in the Saturday clinic on 11-08-11 and was given a course of Augmentin and a Medrol dose pack. She felt a little better for a few days but now the chest congestion is back. No fevers. The rash is still present but it seems to have waned a bit from earlier in the week. This ihas been a macular erythematous rash that shows annular lesions with central clearings. This has primarily involved the arms and legs but to some extent has been on the trunk also.    Review of Systems  Constitutional: Negative.   HENT: Positive for congestion, sore throat, postnasal drip and sinus pressure.   Eyes: Negative.   Respiratory: Positive for cough, chest tightness, shortness of breath and wheezing.   Skin: Positive for rash.       Objective:   Physical Exam  Constitutional: She appears well-developed and well-nourished.  HENT:  Right Ear: External ear normal.  Left Ear: External ear normal.  Nose: Nose normal.  Mouth/Throat: Oropharynx is clear and moist.  Eyes: Pupils are equal, round, and reactive to light.  Neck: No thyromegaly present.  Pulmonary/Chest: Effort normal. She has no rales.       Scattered rhonchi and wheezes   Lymphadenopathy:    She has no cervical adenopathy.  Skin:       Diffuse annular rash as above           Assessment & Plan:  This seems to have originated as a viral infection with an exanthem that has evolved to a bronchitis. We will treat with a Zpack. Given a nebulization treatment an a steroid shot. Recheck prn

## 2011-11-21 NOTE — Telephone Encounter (Signed)
Caller: Terry/Mother; Patient Name: Amanda Garrison; PCP: Madelin Headings.; Best Callback Phone Number: [phone].       Viral symptoms, headache, stuffy nose cough then rash.  Rash started as one spot then spread gradually over body until became generalized  lacey red rash.  Was seen in office on 11/08/2011, told rash due to viral infection, would not be surprised if it comes back.  Started Medrol DosePak, Augmentin for 21 days, Zyrtec, Benadryl at HS.  Seemed to improve and has been back in school.  Has finished the Prednisone, now rash starting back again on weekend 8/3 with one spot and has gradually spread over her body again.   Health history, medications, and allergies reviewed:   yes Date of onset of symptoms:  this time 11/15/2011 Any treatments/meds tried for symptoms and did they work, (please document med, dosage and time): still on treatment from last rash 11/08/2011 Last Menstrual Period:  Says unsure, is on BCP Guideline viewed: Rash Widespread and Cause Unknown Pediatric Disposition: See Provider Within 72 Hours Advice given:  Call back if: Symptoms get worse Appointment scheduled at 3:00 pm today Dr Abran Cantor

## 2011-11-24 ENCOUNTER — Telehealth: Payer: Self-pay | Admitting: Family Medicine

## 2011-11-24 ENCOUNTER — Encounter: Payer: Self-pay | Admitting: Family Medicine

## 2011-11-24 NOTE — Telephone Encounter (Signed)
[facility] Triage Call Report Triage Record Num: [id number] Operator: Terance Ice Patient Name: Amanda Garrison Call Date & Time: 11/21/2011 9:17:34PM Patient Phone: [phone] PCP: [name]. [name] Patient Gender: Female PCP Fax : [phone] Patient DOB: [birth year] [facility] Name: [facility] Reason for Call: Caller: [name]/Patient; PCP: Madelin Headings.; CB#: [phone]; Call regarding Medication Issue; Medication(s): Steroid not at [facility] pharmacy today; Prednisone tab 10 mg take "as directed" dispense 100 tablets with no refills per [facility] EPIC by Dr. Claris Che on 11/21/11; s/w pharmacist/Lisa at [facility] on [address] [phone] to authorize (since [facility] pharmacy closed); Guideline: Medication Questions - Adult; Disp: provided health information override to call pharmacy now; Appt? no. Protocol(s) Used: Medication Questions - Adult Recommended Outcome per Protocol: Provided Health Information Override Outcome if Used in Protocol: Call Dispensing Pharmacy or Provider Immediately RN Reason for Override Outcome: Physician Orders. Reason for Outcome: Caller has medication question(s) that was answered with available resources Care Advice: ~ 08/

## 2012-01-05 ENCOUNTER — Telehealth: Payer: Self-pay | Admitting: Family Medicine

## 2012-01-05 NOTE — Telephone Encounter (Signed)
please change to loestrin 1/20  28 days  Pack  With appropriate refills.

## 2012-01-05 NOTE — Telephone Encounter (Signed)
Aurther Loft (mom) called to inform me that Loestrin 24FE is no longer being made.  All three of the [place] sisters will need to be changed to a different BC.  Aurther Loft did speak to the pharmacist Brett Canales) at [facility].  She informed me that he named a new pill that is like the old.  She wanted me to call to find out more informatin.  I spoke to [place] today.  He informed me that the loestrin 1/20 is similar.  The estrogen component is the same but the progesterone is slightly different.  Please advise new rx for all three sisters.  Thanks!!!

## 2012-01-06 ENCOUNTER — Other Ambulatory Visit: Payer: Self-pay | Admitting: Family Medicine

## 2012-01-06 ENCOUNTER — Other Ambulatory Visit: Payer: Self-pay | Admitting: Internal Medicine

## 2012-01-06 MED ORDER — NORETHINDRONE ACET-ETHINYL EST 1-20 MG-MCG PO TABS: 1.0000 | ORAL_TABLET | Freq: Every day | ORAL | Status: DC

## 2012-01-06 NOTE — Telephone Encounter (Signed)
Terry informed medication sent to [facility].

## 2012-01-07 ENCOUNTER — Telehealth: Payer: Self-pay | Admitting: Family Medicine

## 2012-01-07 NOTE — Telephone Encounter (Signed)
Rash ongoing since middle of July.  Treated at [facility] office by Dr. Rogelia Rohrer for viral exanthem.  Started on prednisone and augmentin.  Prednisone course about 3 weeks.  Also treated by Dr. Clent Ridges with a slow taper of prednisone.  If she takes less than 15mg  of prednisone the rash reappears.  Mom is requesting a refill of prednisone until can be seen by Hewitt Shorts (dermatology) and also requesting referral.  Using [facility].  Please advise.  Thanks!!!

## 2012-01-07 NOTE — Telephone Encounter (Signed)
Unable to wean from prednisone for a viral rash  after 3 -4 weeks seems too long  . Since not itching  And not hives (although i did not  Evaluated in office visit ).   Advise derm to see  . MOM said she would make appt   ? If mycoplasma could do this .  No labs done in record.  In the meantime try to wean  rx prednisone 5 mg    Take 15 mg  Per day and wean to  10 mg and then 5 mg  Disp # 60

## 2012-01-08 ENCOUNTER — Telehealth: Payer: Self-pay | Admitting: Family Medicine

## 2012-01-08 ENCOUNTER — Other Ambulatory Visit: Payer: Self-pay | Admitting: Family Medicine

## 2012-01-08 DIAGNOSIS — R21 Rash and other nonspecific skin eruption: Secondary | ICD-10-CM

## 2012-01-08 MED ORDER — PREDNISONE 5 MG PO TABS: ORAL_TABLET | ORAL | Status: DC

## 2012-01-08 NOTE — Telephone Encounter (Signed)
Please send this patient to Dr. Hewitt Shorts.  Sorry, forgot to put this in the order.

## 2012-01-08 NOTE — Telephone Encounter (Signed)
I told Aurther Loft that I would put in order for Dermatology.  Order placed in the computer.  Prednisone sent to the pharmacy with orders to ween.

## 2012-01-15 ENCOUNTER — Ambulatory Visit (INDEPENDENT_AMBULATORY_CARE_PROVIDER_SITE_OTHER): Payer: 59

## 2012-01-15 DIAGNOSIS — Z23 Encounter for immunization: Secondary | ICD-10-CM

## 2012-02-05 ENCOUNTER — Telehealth: Payer: Self-pay | Admitting: Family Medicine

## 2012-02-05 NOTE — Telephone Encounter (Signed)
Overdue for med evaluation  Last time January 2013 can rx  30 days  Until she  Can get in for OV.   Or make appt abd   Make then may be able to give enough to get in

## 2012-02-05 NOTE — Telephone Encounter (Signed)
Mother requesting refills (90 days) of both medications for school purposes.  Last discussed in OV in Jan.  Please advise.

## 2012-02-06 ENCOUNTER — Other Ambulatory Visit: Payer: Self-pay | Admitting: Family Medicine

## 2012-02-06 MED ORDER — AMPHETAMINE-DEXTROAMPHET ER 20 MG PO CP24: 20.0000 mg | ORAL_CAPSULE | ORAL | Status: DC

## 2012-02-06 MED ORDER — AMPHETAMINE-DEXTROAMPHETAMINE 10 MG PO TABS: ORAL_TABLET | ORAL | Status: DC

## 2012-02-06 NOTE — Telephone Encounter (Signed)
Printed for [facility] to sign.  Amanda Garrison informed to pick up this afternoon after 3.

## 2012-04-09 ENCOUNTER — Encounter: Payer: Self-pay | Admitting: Internal Medicine

## 2012-04-09 ENCOUNTER — Ambulatory Visit (INDEPENDENT_AMBULATORY_CARE_PROVIDER_SITE_OTHER): Payer: 59 | Admitting: Internal Medicine

## 2012-04-09 VITALS — BP 120/74 | HR 84 | Temp 98.2°F | Ht 70.0 in | Wt 148.0 lb

## 2012-04-09 DIAGNOSIS — G4700 Insomnia, unspecified: Secondary | ICD-10-CM

## 2012-04-09 DIAGNOSIS — J3489 Other specified disorders of nose and nasal sinuses: Secondary | ICD-10-CM

## 2012-04-09 DIAGNOSIS — J45990 Exercise induced bronchospasm: Secondary | ICD-10-CM

## 2012-04-09 DIAGNOSIS — Z299 Encounter for prophylactic measures, unspecified: Secondary | ICD-10-CM

## 2012-04-09 DIAGNOSIS — G479 Sleep disorder, unspecified: Secondary | ICD-10-CM

## 2012-04-09 DIAGNOSIS — N946 Dysmenorrhea, unspecified: Secondary | ICD-10-CM

## 2012-04-09 DIAGNOSIS — K219 Gastro-esophageal reflux disease without esophagitis: Secondary | ICD-10-CM

## 2012-04-09 DIAGNOSIS — F909 Attention-deficit hyperactivity disorder, unspecified type: Secondary | ICD-10-CM

## 2012-04-09 DIAGNOSIS — Z23 Encounter for immunization: Secondary | ICD-10-CM

## 2012-04-09 LAB — CBC WITH DIFFERENTIAL/PLATELET
Basophils Relative: 1.1 % (ref 0.0–3.0)
Eosinophils Absolute: 0.1 10*3/uL (ref 0.0–0.7)
Eosinophils Relative: 1.5 % (ref 0.0–5.0)
HCT: 39.8 % (ref 36.0–46.0)
Lymphs Abs: 2.3 10*3/uL (ref 0.7–4.0)
MCHC: 33.6 g/dL (ref 30.0–36.0)
MCV: 90.9 fl (ref 78.0–100.0)
Monocytes Absolute: 0.6 10*3/uL (ref 0.1–1.0)
RBC: 4.38 Mil/uL (ref 3.87–5.11)
WBC: 7.6 10*3/uL (ref 4.5–10.5)

## 2012-04-09 LAB — HEPATIC FUNCTION PANEL
ALT: 15 U/L (ref 0–35)
Albumin: 4.1 g/dL (ref 3.5–5.2)
Total Protein: 7.6 g/dL (ref 6.0–8.3)

## 2012-04-09 LAB — LIPID PANEL
Cholesterol: 171 mg/dL (ref 0–200)
HDL: 63.8 mg/dL (ref >39.00)
LDL Cholesterol: 86 mg/dL (ref 0–99)
Total CHOL/HDL Ratio: 3
Triglycerides: 106 mg/dL (ref 0.0–149.0)

## 2012-04-09 LAB — BASIC METABOLIC PANEL
BUN: 13 mg/dL (ref 6–23)
CO2: 24 mEq/L (ref 19–32)
Chloride: 103 mEq/L (ref 96–112)
Potassium: 4.3 mEq/L (ref 3.5–5.1)

## 2012-04-09 MED ORDER — AMPHETAMINE-DEXTROAMPHETAMINE 10 MG PO TABS: ORAL_TABLET | ORAL | Status: DC

## 2012-04-09 MED ORDER — MUPIROCIN CALCIUM 2 % NA OINT: TOPICAL_OINTMENT | Freq: Two times a day (BID) | NASAL | Status: DC

## 2012-04-09 MED ORDER — NORETHINDRONE ACET-ETHINYL EST 1-20 MG-MCG PO TABS: 1.0000 | ORAL_TABLET | Freq: Every day | ORAL | Status: DC

## 2012-04-09 MED ORDER — RANITIDINE HCL 300 MG PO TABS: 300.0000 mg | ORAL_TABLET | Freq: Two times a day (BID) | ORAL | Status: DC

## 2012-04-09 MED ORDER — AMPHETAMINE-DEXTROAMPHET ER 20 MG PO CP24: 20.0000 mg | ORAL_CAPSULE | ORAL | Status: DC

## 2012-04-09 MED ORDER — ALBUTEROL SULFATE HFA 108 (90 BASE) MCG/ACT IN AERS: 2.0000 | INHALATION_SPRAY | Freq: Four times a day (QID) | RESPIRATORY_TRACT | Status: DC | PRN

## 2012-04-09 NOTE — Progress Notes (Signed)
Chief Complaint  Patient presents with  . Follow-up    HPI: Patient comes in today for follow up of  multiple medical problems.   Ran out of  Ranitidine and  Has stomach aches      nasal crusting. Comes and goes and gets sore not particularly associated with her sinusitis. Not on nasal steroids at this time  Psych  and sport science.   Off campus.   ADHD:USing 20 xr  In am  On  school days  Then 5 - 10 if needed.  For tests and try not to take on weekend.  Uses med 5-6 days. No significant side effects such asand mood changes, chest pain, shortness of breath, headaches , GI or significant weight loss. Although does have sleep issues see below before she was on medication. Can be aggravated by medication take in light   OCPS ;not getting periods. Generic Not as good as loestrin 24   Light    Wd bleeding.   Not as good.   SA   ? Virus   Out of ranitidine  Vomited last m.  3 weeks ago.  So took moms protonix and slightly better   ROS: See pertinent positives and negatives per HPI. Exercise-induced asthma uses inhaler as needed no concerns or flares  Past Medical History  Diagnosis Date  . Exercise-induced asthma   . GERD (gastroesophageal reflux disease)     does well on ranitidine endo 2010  . Exercise-induced asthma   . Acne   . Attention deficit     [facility]  psychology in past  . Hx of varicella   . History of infectious mononucleosis 10/06  . Pharyngitis 11/09/2011    With acute on history of chronic sinusitis    No family history on file.  History   Social History  . Marital Status: Single    Spouse Name: N/A    Number of Children: N/A  . Years of Education: N/A   Social History Main Topics  . Smoking status: Never Smoker   . Smokeless tobacco: None  . Alcohol Use: None  . Drug Use: None  . Sexually Active: None   Other Topics Concern  . 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 albuterol (PROAIR HFA) 108 (90 BASE) MCG/ACT inhaler Inhale 2 puffs into the lungs every 6 (six) hours as needed.  1 Inhaler  2  . amphetamine-dextroamphetamine (ADDERALL XR) 20 MG 24 hr capsule Take 1 capsule (20 mg total) by mouth every morning.  90 capsule  0  . amphetamine-dextroamphetamine (ADDERALL) 10 MG tablet 1/2 -1 po add on for homework  50 tablet  0  . norethindrone-ethinyl estradiol (LOESTRIN 1/20, 21,) 1-20 MG-MCG tablet Take 1 tablet by mouth daily.  3 Package  3  . ranitidine (ZANTAC) 300 MG tablet Take 1 tablet (300 mg total) by mouth 2 (two) times daily.  180 tablet  3  . [DISCONTINUED] albuterol (PROAIR HFA) 108 (90 BASE) MCG/ACT inhaler Inhale 2 puffs into the lungs every 6 (six) hours as needed.  1 Inhaler  3  . [DISCONTINUED] amphetamine-dextroamphetamine (ADDERALL XR) 20 MG 24 hr capsule Take 1 capsule (20 mg total) by mouth every morning.  90 capsule  0  . [DISCONTINUED] amphetamine-dextroamphetamine (ADDERALL) 10 MG tablet 1/2 -1 po add on for  homework  50 tablet  0  . [DISCONTINUED] norethindrone-ethinyl estradiol (LOESTRIN 1/20, 21,) 1-20 MG-MCG tablet Take 1 tablet by mouth daily.  3 Package  1  . [DISCONTINUED] ranitidine (ZANTAC) 300 MG tablet Take 1 tablet (300 mg total) by mouth 2 (two) times daily.  180 tablet  3  . cetirizine (ZYRTEC) 10 MG tablet Take 1 tablet (10 mg total) by mouth 2 (two) times daily.  30 tablet  11  . mupirocin nasal ointment (BACTROBAN) 2 % Place into the nose 2 (two) times daily. Use one-half of tube in each nostril twice daily for five (5) days. After application, press sides of nose together and gently massage.  10 g  0  . [DISCONTINUED] chlorpheniramine-HYDROcodone (TUSSIONEX PENNKINETIC ER) 10-8 MG/5ML LQCR Take 5 mLs by mouth every 12 (twelve) hours as needed.  140 mL  1  . [DISCONTINUED] predniSONE (DELTASONE) 10 MG tablet As directed  100  tablet  0  . [DISCONTINUED] predniSONE (DELTASONE) 5 MG tablet Take 15mg  per day.  Try to decrease to 10mg  and then 5mg .  60 tablet  0    EXAM:  BP 120/74  Pulse 84  Temp 98.2 F (36.8 C)  Ht 5\' 10"  (1.778 m)  Wt 148 lb (67.132 kg)  BMI 21.24 kg/m2  SpO2 99%  LMP 03/17/2012  Body mass index is 21.24 kg/(m^2).  GENERAL: vitals reviewed and listed above, alert, oriented, appears well hydrated and in no acute distress Looks healthy HEENT: atraumatic, conjunctiva  clear, no obvious abnormalities on inspection of external nose and ears OP : no lesion edema or exudate  nose slightly congested face nontender no ulcers seen minimal crusting  NECK: no obvious masses on inspection palpation no adenopathy LUNGS: clear to auscultation bilaterally, no wheezes, rales or rhonchi, good air movement CV: HRRR, no clubbing cyanosis or  peripheral edema nl cap refill  Abdomen:  Sof,t normal bowel sounds without hepatosplenomegaly, no guarding rebound or masses no CVA tenderness MS: moves all extremities without noticeable focal  abnormality  PSYCH: pleasant and cooperative, no obvious depression or anxiety  ASSESSMENT AND PLAN:  Discussed the following assessment and plan:  1. ADHD (attention deficit hyperactivity disorder)  Basic metabolic panel, CBC with Differential, Hepatic function panel, Lipid panel, TSH, T4, free  2. ASTHMA, EXERCISE INDUCED  Basic metabolic panel, CBC with Differential, Hepatic function panel, Lipid panel, TSH, T4, free  3. GERD  Basic metabolic panel, CBC with Differential, Hepatic function panel, Lipid panel, TSH, T4, free   Increased symptoms since off of her ranitidine. Restart diet measures followup if persistent  4. Dysmenorrhea  Basic metabolic panel, CBC with Differential, Hepatic function panel, Lipid panel, TSH, T4, free  5. Insomnia    6. Nasal crusting sores off and on    7. UNSPECIFIED SLEEP DISTURBANCE    8. Preventive measure  Basic metabolic panel, CBC  with Differential, Hepatic function panel, Lipid panel, TSH, T4, free  9. Need for meningococcal vaccination  Meningococcal conjugate vaccine 4-valent IM   discussed use of medication side effects potential consideration of seeing a sleep specialist or have a specialist in sleep therapy would be helpful also. Would be very hesitant to use medication for sleep a routine basis. She will speak with her mother about potential referral. Samples of nasal saline washes given to patient also. Labs today for routine screening evaluation of all of above symptoms. Her now we'll continue on the same OCPs refill when due. Exercise-induced asthma controlled needs rescue inhaler as  needed for exercise -Patient advised to return or notify health care team  immediately if symptoms worsen or persist or new concerns arise.  Patient Instructions  Get back on the ranitidine   For the reflux .  Contact us if not helping  Will notify you  of labs when available.  Can try bactroban in nostril for a week ans ee if helps the nasal sores.  And saline washes .  If not getting better we can get ent to see you.   CBT for sleep has been  The most helpful  For sleep.   We can do a referral to dr Dohmeir for sleep consult   If needed.  Insomnia Insomnia is frequent trouble falling and/or staying asleep. Insomnia can be a long term problem or a short term problem. Both are common. Insomnia can be a short term problem when the wakefulness is related to a certain stress or worry. Long term insomnia is often related to ongoing stress during waking hours and/or poor sleeping habits. Overtime, sleep deprivation itself can make the problem worse. Every little thing feels more severe because you are overtired and your ability to cope is decreased. CAUSES   Stress, anxiety, and depression.  Poor sleeping habits.  Distractions such as TV in the bedroom.  Naps close to bedtime.  Engaging in emotionally charged conversations before  bed.  Technical reading before sleep.  Alcohol and other sedatives. They may make the problem worse. They can hurt normal sleep patterns and normal dream activity.  Stimulants such as caffeine for several hours prior to bedtime.  Pain syndromes and shortness of breath can cause insomnia.  Exercise late at night.  Changing time zones may cause sleeping problems (jet lag). It is sometimes helpful to have someone observe your sleeping patterns. They should look for periods of not breathing during the night (sleep apnea). They should also look to see how long those periods last. If you live alone or observers are uncertain, you can also be observed at a sleep clinic where your sleep patterns will be professionally monitored. Sleep apnea requires a checkup and treatment. Give your caregivers your medical history. Give your caregivers observations your family has made about your sleep.  SYMPTOMS   Not feeling rested in the morning.  Anxiety and restlessness at bedtime.  Difficulty falling and staying asleep. TREATMENT   Your caregiver may prescribe treatment for an underlying medical disorders. Your caregiver can give advice or help if you are using alcohol or other drugs for self-medication. Treatment of underlying problems will usually eliminate insomnia problems.  Medications can be prescribed for short time use. They are generally not recommended for lengthy use.  Over-the-counter sleep medicines are not recommended for lengthy use. They can be habit forming.  You can promote easier sleeping by making lifestyle changes such as:  Using relaxation techniques that help with breathing and reduce muscle tension.  Exercising earlier in the day.  Changing your diet and the time of your last meal. No night time snacks.  Establish a regular time to go to bed.  Counseling can help with stressful problems and worry.  Soothing music and white noise may be helpful if there are background  noises you cannot remove.  Stop tedious detailed work at least one hour before bedtime. HOME CARE INSTRUCTIONS   Keep a diary. Inform your caregiver about your progress. This includes any medication side effects. See your caregiver regularly. Take note of:  Times when you are asleep.  Times when you are awake during the night.  The quality of your sleep.  How you feel the next day. This information will help your caregiver care for you.  Get out of bed if you are still awake after 15 minutes. Read or do some quiet activity. Keep the lights down. Wait until you feel sleepy and go back to bed.  Keep regular sleeping and waking hours. Avoid naps.  Exercise regularly.  Avoid distractions at bedtime. Distractions include watching television or engaging in any intense or detailed activity like attempting to balance the household checkbook.  Develop a bedtime ritual. Keep a familiar routine of bathing, brushing your teeth, climbing into bed at the same time each night, listening to soothing music. Routines increase the success of falling to sleep faster.  Use relaxation techniques. This can be using breathing and muscle tension release routines. It can also include visualizing peaceful scenes. You can also help control troubling or intruding thoughts by keeping your mind occupied with boring or repetitive thoughts like the old concept of counting sheep. You can make it more creative like imagining planting one beautiful flower after another in your backyard garden.  During your day, work to eliminate stress. When this is not possible use some of the previous suggestions to help reduce the anxiety that accompanies stressful situations. MAKE SURE YOU:   Understand these instructions.  Will watch your condition.  Will get help right away if you are not doing well or get worse. Document Released: 03/28/2000 Document Revised: 06/23/2011 Document Reviewed: 04/28/2007 [facility] Patient  Information 2013 [place], [place].     [name]. [name] M.D.

## 2012-04-09 NOTE — Patient Instructions (Addendum)
Get back on the ranitidine   For the reflux .  Contact us if not helping  Will notify you  of labs when available.  Can try bactroban in nostril for a week ans ee if helps the nasal sores.  And saline washes .  If not getting better we can get ent to see you.   CBT for sleep has been  The most helpful  For sleep.   We can do a referral to dr Dohmeir for sleep consult   If needed.  Insomnia Insomnia is frequent trouble falling and/or staying asleep. Insomnia can be a long term problem or a short term problem. Both are common. Insomnia can be a short term problem when the wakefulness is related to a certain stress or worry. Long term insomnia is often related to ongoing stress during waking hours and/or poor sleeping habits. Overtime, sleep deprivation itself can make the problem worse. Every little thing feels more severe because you are overtired and your ability to cope is decreased. CAUSES   Stress, anxiety, and depression.  Poor sleeping habits.  Distractions such as TV in the bedroom.  Naps close to bedtime.  Engaging in emotionally charged conversations before bed.  Technical reading before sleep.  Alcohol and other sedatives. They may make the problem worse. They can hurt normal sleep patterns and normal dream activity.  Stimulants such as caffeine for several hours prior to bedtime.  Pain syndromes and shortness of breath can cause insomnia.  Exercise late at night.  Changing time zones may cause sleeping problems (jet lag). It is sometimes helpful to have someone observe your sleeping patterns. They should look for periods of not breathing during the night (sleep apnea). They should also look to see how long those periods last. If you live alone or observers are uncertain, you can also be observed at a sleep clinic where your sleep patterns will be professionally monitored. Sleep apnea requires a checkup and treatment. Give your caregivers your medical history. Give your  caregivers observations your family has made about your sleep.  SYMPTOMS   Not feeling rested in the morning.  Anxiety and restlessness at bedtime.  Difficulty falling and staying asleep. TREATMENT   Your caregiver may prescribe treatment for an underlying medical disorders. Your caregiver can give advice or help if you are using alcohol or other drugs for self-medication. Treatment of underlying problems will usually eliminate insomnia problems.  Medications can be prescribed for short time use. They are generally not recommended for lengthy use.  Over-the-counter sleep medicines are not recommended for lengthy use. They can be habit forming.  You can promote easier sleeping by making lifestyle changes such as:  Using relaxation techniques that help with breathing and reduce muscle tension.  Exercising earlier in the day.  Changing your diet and the time of your last meal. No night time snacks.  Establish a regular time to go to bed.  Counseling can help with stressful problems and worry.  Soothing music and white noise may be helpful if there are background noises you cannot remove.  Stop tedious detailed work at least one hour before bedtime. HOME CARE INSTRUCTIONS   Keep a diary. Inform your caregiver about your progress. This includes any medication side effects. See your caregiver regularly. Take note of:  Times when you are asleep.  Times when you are awake during the night.  The quality of your sleep.  How you feel the next day. This information will help your caregiver care  for you.  Get out of bed if you are still awake after 15 minutes. Read or do some quiet activity. Keep the lights down. Wait until you feel sleepy and go back to bed.  Keep regular sleeping and waking hours. Avoid naps.  Exercise regularly.  Avoid distractions at bedtime. Distractions include watching television or engaging in any intense or detailed activity like attempting to balance  the household checkbook.  Develop a bedtime ritual. Keep a familiar routine of bathing, brushing your teeth, climbing into bed at the same time each night, listening to soothing music. Routines increase the success of falling to sleep faster.  Use relaxation techniques. This can be using breathing and muscle tension release routines. It can also include visualizing peaceful scenes. You can also help control troubling or intruding thoughts by keeping your mind occupied with boring or repetitive thoughts like the old concept of counting sheep. You can make it more creative like imagining planting one beautiful flower after another in your backyard garden.  During your day, work to eliminate stress. When this is not possible use some of the previous suggestions to help reduce the anxiety that accompanies stressful situations. MAKE SURE YOU:   Understand these instructions.  Will watch your condition.  Will get help right away if you are not doing well or get worse. Document Released: 03/28/2000 Document Revised: 06/23/2011 Document Reviewed: 04/28/2007 [facility] Patient Information 2013 [place], [place].

## 2012-04-19 ENCOUNTER — Encounter: Payer: Self-pay | Admitting: Family Medicine

## 2012-04-19 ENCOUNTER — Other Ambulatory Visit: Payer: Self-pay | Admitting: Family Medicine

## 2012-04-19 DIAGNOSIS — R7989 Other specified abnormal findings of blood chemistry: Secondary | ICD-10-CM

## 2012-07-21 ENCOUNTER — Encounter: Payer: Self-pay | Admitting: Internal Medicine

## 2012-07-21 ENCOUNTER — Ambulatory Visit (INDEPENDENT_AMBULATORY_CARE_PROVIDER_SITE_OTHER): Payer: 59 | Admitting: Internal Medicine

## 2012-07-21 VITALS — BP 142/90 | HR 89 | Temp 98.9°F | Wt 143.0 lb

## 2012-07-21 DIAGNOSIS — J321 Chronic frontal sinusitis: Secondary | ICD-10-CM

## 2012-07-21 DIAGNOSIS — J019 Acute sinusitis, unspecified: Secondary | ICD-10-CM

## 2012-07-21 HISTORY — DX: Chronic frontal sinusitis: J32.1

## 2012-07-21 MED ORDER — AMOXICILLIN-POT CLAVULANATE 875-125 MG PO TABS: 1.0000 | ORAL_TABLET | Freq: Two times a day (BID) | ORAL | Status: DC

## 2012-07-21 MED ORDER — HYDROCODONE-HOMATROPINE 5-1.5 MG/5ML PO SYRP: 5.0000 mL | ORAL_SOLUTION | ORAL | Status: DC | PRN

## 2012-07-21 NOTE — Patient Instructions (Signed)
Antibiotic   Try adding the otc nasacort   Saline  Nose spray also .  Expect improvement in the next  3-5 days.

## 2012-07-21 NOTE — Progress Notes (Signed)
Chief Complaint  Patient presents with  . Sore Throat    Cough is productive of a yellow sputum.  Taking Mucinex and Robitussin without much relief.  . Cough  . Sinus pressure/pain    HPI: Patient comes in today for SDA for  new problem evaluation. Here with mom today has had a problem for over 10 days. One sided throat pain on the right that is only recently improved but continued nasal congestion  Throat better but cough  started . Interfering with sleep no hemoptysis no fever chills shortness of breath but has occasional wheezing has used her inhaler.  Frontal pain.  And pressure Tried mucinex  For cough.    Trying to take  Decongestants.   And not better  No fever.   ROS: See pertinent positives and negatives per HPI. Has a history of frontal sinusitis and persistent respiratory infections. Has used some nasal steroids in the past but gets what acts like a frontal nose infection or irritation.  She has final exams in 2 weeks graduation. Considering going into physical therapy  Past Medical History  Diagnosis Date  . Exercise-induced asthma   . GERD (gastroesophageal reflux disease)     does well on ranitidine endo 2010  . Exercise-induced asthma   . Acne   . Attention deficit     [facility]  psychology in past  . Hx of varicella   . History of infectious mononucleosis 10/06  . Pharyngitis 11/09/2011    With acute on history of chronic sinusitis    No family history on file.  History   Social History  . Marital Status: Single    Spouse Name: N/A    Number of Children: N/A  . Years of Education: N/A   Social History Main Topics  . Smoking status: Never Smoker   . Smokeless tobacco: None  . Alcohol Use: None  . Drug Use: None  . Sexually Active: None   Other Topics Concern  . 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 albuterol (PROAIR HFA) 108 (90 BASE) MCG/ACT inhaler Inhale 2 puffs into the lungs every 6 (six) hours as needed.  1 Inhaler  2  . amphetamine-dextroamphetamine (ADDERALL XR) 20 MG 24 hr capsule Take 1 capsule (20 mg total) by mouth every morning.  90 capsule  0  . amphetamine-dextroamphetamine (ADDERALL) 10 MG tablet 1/2 -1 po add on for homework  50 tablet  0  . cetirizine (ZYRTEC) 10 MG tablet Take 1 tablet (10 mg total) by mouth 2 (two) times daily.  30 tablet  11  . mupirocin nasal ointment (BACTROBAN) 2 % Place into the nose 2 (two) times daily. Use one-half of tube in each nostril twice daily for five (5) days. After application, press sides of nose together and gently massage.  10 g  0  . norethindrone-ethinyl estradiol (LOESTRIN 1/20, 21,) 1-20 MG-MCG tablet Take 1 tablet by mouth daily.  3 Package  3  . ranitidine (ZANTAC) 300 MG tablet Take 1 tablet (300 mg total) by mouth 2 (two) times daily.  180 tablet  3  . amoxicillin-clavulanate (AUGMENTIN) 875-125 MG per tablet Take 1 tablet by mouth every 12 (twelve) hours.  28 tablet  0  .  HYDROcodone-homatropine (HYCODAN) 5-1.5 MG/5ML syrup Take 5 mLs by mouth every 4 (four) hours as needed for cough.  180 mL  0   No facility-administered encounter medications on file as of 07/21/2012.    EXAM:  BP 142/90  Pulse 89  Temp(Src) 98.9 F (37.2 C) (Oral)  Wt 143 lb (64.864 kg)  BMI 20.52 kg/m2  SpO2 98%  LMP 07/07/2012  Body mass index is 20.52 kg/(m^2).  GENERAL: vitals reviewed and listed above, alert, oriented, appears well hydrated and in no acute distress looks congested nontoxic  HEENT: atraumatic, conjunctiva  clear, no obvious abnormalities on inspection of external nose and ears nose is congested no masses noted OP : no lesion edema or exudate some redness possible healing large ulcer on the right tonsillar area. Uvula is midline mild frontal tenderness on tapping  NECK: no obvious  masses on inspection palpation mildly tender shotty a.c. nodes.  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 These symptoms are fairly mild but would add antibiotic at this point decongestants saline try OTC Nasacort with care monitor. Cough medicine as needed for night risk-benefit. -Patient advised to return or notify health care team  if symptoms worsen or persist or new concerns arise.  Patient Instructions  Antibiotic   Try adding the otc nasacort   Saline  Nose spray also .  Expect improvement in the next  3-5 days.     [name]. [name] M.D.  ayr Saline  Nasal spray  Sample given

## 2012-08-04 ENCOUNTER — Other Ambulatory Visit: Payer: Self-pay | Admitting: Family Medicine

## 2012-08-04 MED ORDER — AMPHETAMINE-DEXTROAMPHET ER 20 MG PO CP24: 20.0000 mg | ORAL_CAPSULE | ORAL | Status: DC

## 2012-08-04 MED ORDER — AMPHETAMINE-DEXTROAMPHETAMINE 10 MG PO TABS: ORAL_TABLET | ORAL | Status: DC

## 2012-12-06 ENCOUNTER — Telehealth: Payer: Self-pay | Admitting: Family Medicine

## 2012-12-06 NOTE — Telephone Encounter (Signed)
Please review your schedule and see when you will be willing to fit the pt in for CPE.

## 2012-12-07 ENCOUNTER — Ambulatory Visit (INDEPENDENT_AMBULATORY_CARE_PROVIDER_SITE_OTHER): Payer: 59 | Admitting: Family Medicine

## 2012-12-07 DIAGNOSIS — Z111 Encounter for screening for respiratory tuberculosis: Secondary | ICD-10-CM

## 2012-12-10 ENCOUNTER — Other Ambulatory Visit: Payer: Self-pay | Admitting: Family Medicine

## 2012-12-10 ENCOUNTER — Other Ambulatory Visit (INDEPENDENT_AMBULATORY_CARE_PROVIDER_SITE_OTHER): Payer: 59

## 2012-12-10 DIAGNOSIS — R7989 Other specified abnormal findings of blood chemistry: Secondary | ICD-10-CM

## 2012-12-10 DIAGNOSIS — R946 Abnormal results of thyroid function studies: Secondary | ICD-10-CM

## 2012-12-10 DIAGNOSIS — Z0184 Encounter for antibody response examination: Secondary | ICD-10-CM

## 2012-12-10 LAB — TB SKIN TEST: TB Skin Test: NEGATIVE

## 2012-12-17 ENCOUNTER — Ambulatory Visit (INDEPENDENT_AMBULATORY_CARE_PROVIDER_SITE_OTHER): Payer: 59 | Admitting: Internal Medicine

## 2012-12-17 ENCOUNTER — Encounter: Payer: Self-pay | Admitting: Internal Medicine

## 2012-12-17 VITALS — BP 136/90 | HR 88 | Temp 97.6°F | Wt 149.0 lb

## 2012-12-17 DIAGNOSIS — N946 Dysmenorrhea, unspecified: Secondary | ICD-10-CM | POA: Insufficient documentation

## 2012-12-17 DIAGNOSIS — K219 Gastro-esophageal reflux disease without esophagitis: Secondary | ICD-10-CM

## 2012-12-17 DIAGNOSIS — F988 Other specified behavioral and emotional disorders with onset usually occurring in childhood and adolescence: Secondary | ICD-10-CM

## 2012-12-17 DIAGNOSIS — Z0000 Encounter for general adult medical examination without abnormal findings: Secondary | ICD-10-CM

## 2012-12-17 DIAGNOSIS — Z23 Encounter for immunization: Secondary | ICD-10-CM

## 2012-12-17 MED ORDER — AMPHETAMINE-DEXTROAMPHETAMINE 10 MG PO TABS: ORAL_TABLET | ORAL | Status: DC

## 2012-12-17 MED ORDER — AMPHETAMINE-DEXTROAMPHET ER 20 MG PO CP24: 20.0000 mg | ORAL_CAPSULE | ORAL | Status: DC

## 2012-12-17 MED ORDER — NORETHINDRONE ACET-ETHINYL EST 1-20 MG-MCG PO TABS: 1.0000 | ORAL_TABLET | Freq: Every day | ORAL | Status: DC

## 2012-12-17 NOTE — Patient Instructions (Addendum)
Ir is advised to take the long acting adderall on a regular basis   As we discussed. Advise pap smear .    Agree with limiting ranitidine as tolerated . Preventive Care for Adults, Female A healthy lifestyle and preventive care can promote health and wellness. Preventive health guidelines for women include the following key practices.  A routine yearly physical is a good way to check with your caregiver about your health and preventive screening. It is a chance to share any concerns and updates on your health, and to receive a thorough exam.  Visit your dentist for a routine exam and preventive care every 6 months. Brush your teeth twice a day and floss once a day. Good oral hygiene prevents tooth decay and gum disease.  The frequency of eye exams is based on your age, health, family medical history, use of contact lenses, and other factors. Follow your caregiver's recommendations for frequency of eye exams.  Eat a healthy diet. Foods like vegetables, fruits, whole grains, low-fat dairy products, and lean protein foods contain the nutrients you need without too many calories. Decrease your intake of foods high in solid fats, added sugars, and salt. Eat the right amount of calories for you.Get information about a proper diet from your caregiver, if necessary.  Regular physical exercise is one of the most important things you can do for your health. Most adults should get at least 150 minutes of moderate-intensity exercise (any activity that increases your heart rate and causes you to sweat) each week. In addition, most adults need muscle-strengthening exercises on 2 or more days a week.  Maintain a healthy weight. The body mass index (BMI) is a screening tool to identify possible weight problems. It provides an estimate of body fat based on height and weight. Your caregiver can help determine your BMI, and can help you achieve or maintain a healthy weight.For adults 20 years and older:  A BMI  below 18.5 is considered underweight.  A BMI of 18.5 to 24.9 is normal.  A BMI of 25 to 29.9 is considered overweight.  A BMI of 30 and above is considered obese.  Maintain normal blood lipids and cholesterol levels by exercising and minimizing your intake of saturated fat. Eat a balanced diet with plenty of fruit and vegetables. Blood tests for lipids and cholesterol should begin at age 78 and be repeated every 5 years. If your lipid or cholesterol levels are high, you are over 50, or you are at high risk for heart disease, you may need your cholesterol levels checked more frequently.Ongoing high lipid and cholesterol levels should be treated with medicines if diet and exercise are not effective.  If you smoke, find out from your caregiver how to quit. If you do not use tobacco, do not start.  If you are pregnant, do not drink alcohol. If you are breastfeeding, be very cautious about drinking alcohol. If you are not pregnant and choose to drink alcohol, do not exceed 1 drink per day. One drink is considered to be 12 ounces (355 mL) of beer, 5 ounces (148 mL) of wine, or 1.5 ounces (44 mL) of liquor.  Avoid use of street drugs. Do not share needles with anyone. Ask for help if you need support or instructions about stopping the use of drugs.  High blood pressure causes heart disease and increases the risk of stroke. Your blood pressure should be checked at least every 1 to 2 years. Ongoing high blood pressure should be treated  with medicines if weight loss and exercise are not effective.  If you are 52 to 22 years old, ask your caregiver if you should take aspirin to prevent strokes.  Diabetes screening involves taking a blood sample to check your fasting blood sugar level. This should be done once every 3 years, after age 79, if you are within normal weight and without risk factors for diabetes. Testing should be considered at a younger age or be carried out more frequently if you are  overweight and have at least 1 risk factor for diabetes.  Breast cancer screening is essential preventive care for women. You should practice "breast self-awareness." This means understanding the normal appearance and feel of your breasts and may include breast self-examination. Any changes detected, no matter how small, should be reported to a caregiver. Women in their 55s and 30s should have a clinical breast exam (CBE) by a caregiver as part of a regular health exam every 1 to 3 years. After age 9, women should have a CBE every year. Starting at age 69, women should consider having a mammography (breast X-ray test) every year. Women who have a family history of breast cancer should talk to their caregiver about genetic screening. Women at a high risk of breast cancer should talk to their caregivers about having magnetic resonance imaging (MRI) and a mammography every year.  The Pap test is a screening test for cervical cancer. A Pap test can show cell changes on the cervix that might become cervical cancer if left untreated. A Pap test is a procedure in which cells are obtained and examined from the lower end of the uterus (cervix).  Women should have a Pap test starting at age 50.  Between ages 66 and 60, Pap tests should be repeated every 2 years.  Beginning at age 60, you should have a Pap test every 3 years as long as the past 3 Pap tests have been normal.  Some women have medical problems that increase the chance of getting cervical cancer. Talk to your caregiver about these problems. It is especially important to talk to your caregiver if a new problem develops soon after your last Pap test. In these cases, your caregiver may recommend more frequent screening and Pap tests.  The above recommendations are the same for women who have or have not gotten the vaccine for human papillomavirus (HPV).  If you had a hysterectomy for a problem that was not cancer or a condition that could lead to  cancer, then you no longer need Pap tests. Even if you no longer need a Pap test, a regular exam is a good idea to make sure no other problems are starting.  If you are between ages 47 and 37, and you have had normal Pap tests going back 10 years, you no longer need Pap tests. Even if you no longer need a Pap test, a regular exam is a good idea to make sure no other problems are starting.  If you have had past treatment for cervical cancer or a condition that could lead to cancer, you need Pap tests and screening for cancer for at least 20 years after your treatment.  If Pap tests have been discontinued, risk factors (such as a new sexual partner) need to be reassessed to determine if screening should be resumed.  The HPV test is an additional test that may be used for cervical cancer screening. The HPV test looks for the virus that can cause the cell  changes on the cervix. The cells collected during the Pap test can be tested for HPV. The HPV test could be used to screen women aged 60 years and older, and should be used in women of any age who have unclear Pap test results. After the age of 41, women should have HPV testing at the same frequency as a Pap test.  Colorectal cancer can be detected and often prevented. Most routine colorectal cancer screening begins at the age of 30 and continues through age 71. However, your caregiver may recommend screening at an earlier age if you have risk factors for colon cancer. On a yearly basis, your caregiver may provide home test kits to check for hidden blood in the stool. Use of a small camera at the end of a tube, to directly examine the colon (sigmoidoscopy or colonoscopy), can detect the earliest forms of colorectal cancer. Talk to your caregiver about this at age 9, when routine screening begins. Direct examination of the colon should be repeated every 5 to 10 years through age 62, unless early forms of pre-cancerous polyps or small growths are  found.  Hepatitis C blood testing is recommended for all people born from [birth year] through 1965 and any individual with known risks for hepatitis C.  Practice safe sex. Use condoms and avoid high-risk sexual practices to reduce the spread of sexually transmitted infections (STIs). STIs include gonorrhea, chlamydia, syphilis, trichomonas, herpes, HPV, and human immunodeficiency virus (HIV). Herpes, HIV, and HPV are viral illnesses that have no cure. They can result in disability, cancer, and death. Sexually active women aged 54 and younger should be checked for chlamydia. Older women with new or multiple partners should also be tested for chlamydia. Testing for other STIs is recommended if you are sexually active and at increased risk.  Osteoporosis is a disease in which the bones lose minerals and strength with aging. This can result in serious bone fractures. The risk of osteoporosis can be identified using a bone density scan. Women ages 53 and over and women at risk for fractures or osteoporosis should discuss screening with their caregivers. Ask your caregiver whether you should take a calcium supplement or vitamin D to reduce the rate of osteoporosis.  Menopause can be associated with physical symptoms and risks. Hormone replacement therapy is available to decrease symptoms and risks. You should talk to your caregiver about whether hormone replacement therapy is right for you.  Use sunscreen with sun protection factor (SPF) of 30 or more. Apply sunscreen liberally and repeatedly throughout the day. You should seek shade when your shadow is shorter than you. Protect yourself by wearing long sleeves, pants, a wide-brimmed hat, and sunglasses year round, whenever you are outdoors.  Once a month, do a whole body skin exam, using a mirror to look at the skin on your back. Notify your caregiver of new moles, moles that have irregular borders, moles that are larger than a pencil eraser, or moles that have  changed in shape or color.  Stay current with required immunizations.  Influenza. You need a dose every fall (or winter). The composition of the flu vaccine changes each year, so being vaccinated once is not enough.  Pneumococcal polysaccharide. You need 1 to 2 doses if you smoke cigarettes or if you have certain chronic medical conditions. You need 1 dose at age 40 (or older) if you have never been vaccinated.  Tetanus, diphtheria, pertussis (Tdap, Td). Get 1 dose of Tdap vaccine if you are younger than  age 51, are over 9 and have contact with an infant, are a Research scientist (physical sciences), are pregnant, or simply want to be protected from whooping cough. After that, you need a Td booster dose every 10 years. Consult your caregiver if you have not had at least 3 tetanus and diphtheria-containing shots sometime in your life or have a deep or dirty wound.  HPV. You need this vaccine if you are a woman age 52 or younger. The vaccine is given in 3 doses over 6 months.  Measles, mumps, rubella (MMR). You need at least 1 dose of MMR if you were born in 1957 or later. You may also need a second dose.  Meningococcal. If you are age 37 to 70 and a first-year college student living in a residence hall, or have one of several medical conditions, you need to get vaccinated against meningococcal disease. You may also need additional booster doses.  Zoster (shingles). If you are age 64 or older, you should get this vaccine.  Varicella (chickenpox). If you have never had chickenpox or you were vaccinated but received only 1 dose, talk to your caregiver to find out if you need this vaccine.  Hepatitis A. You need this vaccine if you have a specific risk factor for hepatitis A virus infection or you simply wish to be protected from this disease. The vaccine is usually given as 2 doses, 6 to 18 months apart.  Hepatitis B. You need this vaccine if you have a specific risk factor for hepatitis B virus infection or you  simply wish to be protected from this disease. The vaccine is given in 3 doses, usually over 6 months. Preventive Services / Frequency Ages 26 to 65  Blood pressure check.** / Every 1 to 2 years.  Lipid and cholesterol check.** / Every 5 years beginning at age 31.  Clinical breast exam.** / Every 3 years for women in their 4s and 30s.  Pap test.** / Every 2 years from ages 78 through 87. Every 3 years starting at age 12 through age 72 or 97 with a history of 3 consecutive normal Pap tests.  HPV screening.** / Every 3 years from ages 51 through ages 19 to 50 with a history of 3 consecutive normal Pap tests.  Hepatitis C blood test.** / For any individual with known risks for hepatitis C.  Skin self-exam. / Monthly.  Influenza immunization.** / Every year.  Pneumococcal polysaccharide immunization.** / 1 to 2 doses if you smoke cigarettes or if you have certain chronic medical conditions.  Tetanus, diphtheria, pertussis (Tdap, Td) immunization. / A one-time dose of Tdap vaccine. After that, you need a Td booster dose every 10 years.  HPV immunization. / 3 doses over 6 months, if you are 85 and younger.  Measles, mumps, rubella (MMR) immunization. / You need at least 1 dose of MMR if you were born in 1957 or later. You may also need a second dose.  Meningococcal immunization. / 1 dose if you are age 8 to 27 and a first-year college student living in a residence hall, or have one of several medical conditions, you need to get vaccinated against meningococcal disease. You may also need additional booster doses.  Varicella immunization.** / Consult your caregiver.  Hepatitis A immunization.** / Consult your caregiver. 2 doses, 6 to 18 months apart.  Hepatitis B immunization.** / Consult your caregiver. 3 doses usually over 6 months. Ages 52 to 18  Blood pressure check.** / Every 1 to 2 years.  Lipid and cholesterol check.** / Every 5 years beginning at age 80.  Clinical breast  exam.** / Every year after age 45.  Mammogram.** / Every year beginning at age 20 and continuing for as long as you are in good health. Consult with your caregiver.  Pap test.** / Every 3 years starting at age 55 through age 62 or 33 with a history of 3 consecutive normal Pap tests.  HPV screening.** / Every 3 years from ages 68 through ages 53 to 18 with a history of 3 consecutive normal Pap tests.  Fecal occult blood test (FOBT) of stool. / Every year beginning at age 72 and continuing until age 13. You may not need to do this test if you get a colonoscopy every 10 years.  Flexible sigmoidoscopy or colonoscopy.** / Every 5 years for a flexible sigmoidoscopy or every 10 years for a colonoscopy beginning at age 86 and continuing until age 9.  Hepatitis C blood test.** / For all people born from [birth year] through 1965 and any individual with known risks for hepatitis C.  Skin self-exam. / Monthly.  Influenza immunization.** / Every year.  Pneumococcal polysaccharide immunization.** / 1 to 2 doses if you smoke cigarettes or if you have certain chronic medical conditions.  Tetanus, diphtheria, pertussis (Tdap, Td) immunization.** / A one-time dose of Tdap vaccine. After that, you need a Td booster dose every 10 years.  Measles, mumps, rubella (MMR) immunization. / You need at least 1 dose of MMR if you were born in 1957 or later. You may also need a second dose.  Varicella immunization.** / Consult your caregiver.  Meningococcal immunization.** / Consult your caregiver.  Hepatitis A immunization.** / Consult your caregiver. 2 doses, 6 to 18 months apart.  Hepatitis B immunization.** / Consult your caregiver. 3 doses, usually over 6 months. Ages 17 and over  Blood pressure check.** / Every 1 to 2 years.  Lipid and cholesterol check.** / Every 5 years beginning at age 38.  Clinical breast exam.** / Every year after age 29.  Mammogram.** / Every year beginning at age 47 and continuing  for as long as you are in good health. Consult with your caregiver.  Pap test.** / Every 3 years starting at age 19 through age 16 or 62 with a 3 consecutive normal Pap tests. Testing can be stopped between 65 and 70 with 3 consecutive normal Pap tests and no abnormal Pap or HPV tests in the past 10 years.  HPV screening.** / Every 3 years from ages 49 through ages 14 or 46 with a history of 3 consecutive normal Pap tests. Testing can be stopped between 65 and 70 with 3 consecutive normal Pap tests and no abnormal Pap or HPV tests in the past 10 years.  Fecal occult blood test (FOBT) of stool. / Every year beginning at age 2 and continuing until age 65. You may not need to do this test if you get a colonoscopy every 10 years.  Flexible sigmoidoscopy or colonoscopy.** / Every 5 years for a flexible sigmoidoscopy or every 10 years for a colonoscopy beginning at age 81 and continuing until age 3.  Hepatitis C blood test.** / For all people born from [birth year] through 1965 and any individual with known risks for hepatitis C.  Osteoporosis screening.** / A one-time screening for women ages 48 and over and women at risk for fractures or osteoporosis.  Skin self-exam. / Monthly.  Influenza immunization.** / Every year.  Pneumococcal polysaccharide immunization.** /  1 dose at age 56 (or older) if you have never been vaccinated.  Tetanus, diphtheria, pertussis (Tdap, Td) immunization. / A one-time dose of Tdap vaccine if you are over 65 and have contact with an infant, are a Research scientist (physical sciences), or simply want to be protected from whooping cough. After that, you need a Td booster dose every 10 years.  Varicella immunization.** / Consult your caregiver.  Meningococcal immunization.** / Consult your caregiver.  Hepatitis A immunization.** / Consult your caregiver. 2 doses, 6 to 18 months apart.  Hepatitis B immunization.** / Check with your caregiver. 3 doses, usually over 6 months. ** Family history  and personal history of risk and conditions may change your caregiver's recommendations. Document Released: 05/27/2001 Document Revised: 06/23/2011 Document Reviewed: 08/26/2010 [facility] Patient Information 2014 [place], [place].

## 2012-12-17 NOTE — Progress Notes (Signed)
Chief Complaint  Patient presents with  . Annual Exam    HPI: Patient comes in today for Preventive Health Care visit  Refill meds :  allergic sinusitis:  nasacort   Some help and zyrtec.   ADD med  Studying for GREs  And  Not using as many of  Long acting  With studying short acting . Denies substantial se    Shadowing  8 - 5 and then studying .   Take xr those days and  5 - 10 mg add on .   periods  Ok needs refill   Periods less heavy   2 months without spotting.  Taking 24 days at a time  Cramps and acne better   Had sti check in past declines pap today   Trying to wean zantac  gets bt HHB but tolerated q d  ROS:  GEN/ HEENT: No fever, significant weight changes sweats headaches vision problems hearing changes, CV/ PULM; No chest pain shortness of breath cough, syncope,edema  change in exercise tolerance. GI /GU: No adominal pain, vomiting, change in bowel habits. No blood in the stool. No significant GU symptoms. SKIN/HEME: ,no acute skin rashes suspicious lesions or bleeding. No lymphadenopathy, nodules, masses.  NEURO/ PSYCH:  No neurologic signs such as weakness numbness. No depression anxiety. IMM/ Allergy: No unusual infections.  Allergy .   REST of 12 system review negative except as per HPI Net td some tea ocass wine etoh 2 x per week   Past Medical History  Diagnosis Date  . Exercise-induced asthma   . GERD (gastroesophageal reflux disease)     does well on ranitidine endo 2010  . Exercise-induced asthma   . Acne   . Attention deficit     [facility]  psychology in past  . Hx of varicella   . History of infectious mononucleosis 10/06  . Pharyngitis 11/09/2011    With acute on history of chronic sinusitis  . Frontal sinusitis 07/21/2012  . Dysmenorrhea 11/29/2007    Qualifier: Diagnosis of  By: Fabian Sharp MD, [name]     Family History  Problem Relation Age of Onset  . ADD / ADHD Sister     History   Social History  . Marital Status: Single    Spouse Name:  N/A    Number of Children: N/A  . Years of Education: N/A   Social History Main Topics  . Smoking status: Never Smoker   . Smokeless tobacco: None  . Alcohol Use: None  . Drug Use: None  . Sexual Activity: None   Other Topics Concern  . None   Social History Narrative   [employer]  Good grades minor in psychology major exercise sports physiology senior      NOn smoker              Outpatient Encounter Prescriptions as of 12/17/2012  Medication Sig Dispense Refill  . albuterol (PROAIR HFA) 108 (90 BASE) MCG/ACT inhaler Inhale 2 puffs into the lungs every 6 (six) hours as needed.  1 Inhaler  2  . amphetamine-dextroamphetamine (ADDERALL XR) 20 MG 24 hr capsule Take 1 capsule (20 mg total) by mouth every morning.  90 capsule  0  . amphetamine-dextroamphetamine (ADDERALL) 10 MG tablet 1/2 -1 po add on for homework  50 tablet  0  . norethindrone-ethinyl estradiol (LOESTRIN 1/20, 21,) 1-20 MG-MCG tablet Take 1 tablet by mouth daily.  4 Package  3  . ranitidine (ZANTAC) 300 MG tablet Take 1  tablet (300 mg total) by mouth 2 (two) times daily.  180 tablet  3  . [DISCONTINUED] amphetamine-dextroamphetamine (ADDERALL XR) 20 MG 24 hr capsule Take 1 capsule (20 mg total) by mouth every morning.  90 capsule  0  . [DISCONTINUED] amphetamine-dextroamphetamine (ADDERALL) 10 MG tablet 1/2 -1 po add on for homework  50 tablet  0  . [DISCONTINUED] norethindrone-ethinyl estradiol (LOESTRIN 1/20, 21,) 1-20 MG-MCG tablet Take 1 tablet by mouth daily.  3 Package  3  . [DISCONTINUED] norethindrone-ethinyl estradiol (LOESTRIN 1/20, 21,) 1-20 MG-MCG tablet Take 1 tablet by mouth daily.  3 Package  3  . cetirizine (ZYRTEC) 10 MG tablet Take 1 tablet (10 mg total) by mouth 2 (two) times daily.  30 tablet  11  . mupirocin nasal ointment (BACTROBAN) 2 % Place into the nose 2 (two) times daily. Use one-half of tube in each nostril twice daily for five (5) days. After application, press sides of nose together and gently  massage.  10 g  0  . [DISCONTINUED] amoxicillin-clavulanate (AUGMENTIN) 875-125 MG per tablet Take 1 tablet by mouth every 12 (twelve) hours.  28 tablet  0  . [DISCONTINUED] HYDROcodone-homatropine (HYCODAN) 5-1.5 MG/5ML syrup Take 5 mLs by mouth every 4 (four) hours as needed for cough.  180 mL  0   No facility-administered encounter medications on file as of 12/17/2012.    EXAM:  BP 136/90  Pulse 88  Temp(Src) 97.6 F (36.4 C) (Oral)  Wt 149 lb (67.586 kg)  BMI 21.38 kg/m2  SpO2 99%  LMP 11/28/2012  Body mass index is 21.38 kg/(m^2).  Physical Exam: Vital signs reviewed RUE:AVWU is a well-developed well-nourished alert cooperative   female who appears her stated age in no acute distress.  HEENT: normocephalic atraumatic , Eyes: PERRL EOM's full, conjunctiva clear, Nares: paten,t no deformity discharge or tenderness., Ears: no deformity EAC's clear TMs with normal landmarks. Mouth: clear OP, no lesions, edema.  Moist mucous membranes. Dentition in adequate repair. NECK: supple without masses, thyromegaly or bruits. CHEST/PULM:  Clear to auscultation and percussion breath sounds equal no wheeze , rales or rhonchi. No chest wall deformities or tenderness. Breast: normal by inspection . No dimpling, discharge, masses, tenderness or discharge . CV: PMI is nondisplaced, S1 S2 no gallops, murmurs, rubs. Peripheral pulses are full without delay.No JVD .  ABDOMEN: Bowel sounds normal nontender  No guard or rebound, no hepato splenomegal no CVA tenderness.  No hernia. Extremtities:  No clubbing cyanosis or edema, no acute joint swelling or redness no focal atrophy NEURO:  Oriented x3, cranial nerves 3-12 appear to be intact, no obvious focal weakness,gait within normal limits no abnormal reflexes or asymmetrical SKIN: No acute rashes normal turgor, color, no bruising or petechiae. PSYCH: Oriented, good eye contact, no obvious depression anxiety, cognition and judgment appear normal. LN: no  cervical axillary inguinal adenopathy Pap declined  Lab Results  Component Value Date   WBC 7.6 04/09/2012   HGB 13.4 04/09/2012   HCT 39.8 04/09/2012   PLT 328.0 04/09/2012   GLUCOSE 75 04/09/2012   CHOL 171 04/09/2012   TRIG 106.0 04/09/2012   HDL 63.80 04/09/2012   LDLCALC 86 04/09/2012   ALT 15 04/09/2012   AST 19 04/09/2012   NA 137 04/09/2012   K 4.3 04/09/2012   CL 103 04/09/2012   CREATININE 0.8 04/09/2012   BUN 13 04/09/2012   CO2 24 04/09/2012   TSH 0.65 12/10/2012    ASSESSMENT AND PLAN:  Discussed the following  assessment and plan:  Visit for preventive health examination - utd  except fro pap smear never had one pt will do in next year and disc with mom   Need for prophylactic vaccination and inoculation against influenza - Plan: Flu Vaccine QUAD 36+ mos PF IM (Fluarix)  Attention deficit disorder - reviewed med prefer xr for her and caution with add on med and se .benefit more than risk : 3 month rx given   Dysmenorrhea  GERD Counseled meds and fu  Follow bp readings    pt declined  Pap today today   Can take ocp continuously as discussed  Patient Care Team: Madelin Headings, MD as PCP - General Patient Instructions  Ir is advised to take the long acting adderall on a regular basis   As we discussed. Advise pap smear .    Agree with limiting ranitidine as tolerated . Preventive Care for Adults, Female A healthy lifestyle and preventive care can promote health and wellness. Preventive health guidelines for women include the following key practices.  A routine yearly physical is a good way to check with your caregiver about your health and preventive screening. It is a chance to share any concerns and updates on your health, and to receive a thorough exam.  Visit your dentist for a routine exam and preventive care every 6 months. Brush your teeth twice a day and floss once a day. Good oral hygiene prevents tooth decay and gum disease.  The frequency of  eye exams is based on your age, health, family medical history, use of contact lenses, and other factors. Follow your caregiver's recommendations for frequency of eye exams.  Eat a healthy diet. Foods like vegetables, fruits, whole grains, low-fat dairy products, and lean protein foods contain the nutrients you need without too many calories. Decrease your intake of foods high in solid fats, added sugars, and salt. Eat the right amount of calories for you.Get information about a proper diet from your caregiver, if necessary.  Regular physical exercise is one of the most important things you can do for your health. Most adults should get at least 150 minutes of moderate-intensity exercise (any activity that increases your heart rate and causes you to sweat) each week. In addition, most adults need muscle-strengthening exercises on 2 or more days a week.  Maintain a healthy weight. The body mass index (BMI) is a screening tool to identify possible weight problems. It provides an estimate of body fat based on height and weight. Your caregiver can help determine your BMI, and can help you achieve or maintain a healthy weight.For adults 20 years and older:  A BMI below 18.5 is considered underweight.  A BMI of 18.5 to 24.9 is normal.  A BMI of 25 to 29.9 is considered overweight.  A BMI of 30 and above is considered obese.  Maintain normal blood lipids and cholesterol levels by exercising and minimizing your intake of saturated fat. Eat a balanced diet with plenty of fruit and vegetables. Blood tests for lipids and cholesterol should begin at age 85 and be repeated every 5 years. If your lipid or cholesterol levels are high, you are over 50, or you are at high risk for heart disease, you may need your cholesterol levels checked more frequently.Ongoing high lipid and cholesterol levels should be treated with medicines if diet and exercise are not effective.  If you smoke, find out from your caregiver  how to quit. If you do not use tobacco, do  not start.  If you are pregnant, do not drink alcohol. If you are breastfeeding, be very cautious about drinking alcohol. If you are not pregnant and choose to drink alcohol, do not exceed 1 drink per day. One drink is considered to be 12 ounces (355 mL) of beer, 5 ounces (148 mL) of wine, or 1.5 ounces (44 mL) of liquor.  Avoid use of street drugs. Do not share needles with anyone. Ask for help if you need support or instructions about stopping the use of drugs.  High blood pressure causes heart disease and increases the risk of stroke. Your blood pressure should be checked at least every 1 to 2 years. Ongoing high blood pressure should be treated with medicines if weight loss and exercise are not effective.  If you are 39 to 22 years old, ask your caregiver if you should take aspirin to prevent strokes.  Diabetes screening involves taking a blood sample to check your fasting blood sugar level. This should be done once every 3 years, after age 42, if you are within normal weight and without risk factors for diabetes. Testing should be considered at a younger age or be carried out more frequently if you are overweight and have at least 1 risk factor for diabetes.  Breast cancer screening is essential preventive care for women. You should practice "breast self-awareness." This means understanding the normal appearance and feel of your breasts and may include breast self-examination. Any changes detected, no matter how small, should be reported to a caregiver. Women in their 42s and 30s should have a clinical breast exam (CBE) by a caregiver as part of a regular health exam every 1 to 3 years. After age 53, women should have a CBE every year. Starting at age 22, women should consider having a mammography (breast X-ray test) every year. Women who have a family history of breast cancer should talk to their caregiver about genetic screening. Women at a high risk of  breast cancer should talk to their caregivers about having magnetic resonance imaging (MRI) and a mammography every year.  The Pap test is a screening test for cervical cancer. A Pap test can show cell changes on the cervix that might become cervical cancer if left untreated. A Pap test is a procedure in which cells are obtained and examined from the lower end of the uterus (cervix).  Women should have a Pap test starting at age 56.  Between ages 34 and 63, Pap tests should be repeated every 2 years.  Beginning at age 35, you should have a Pap test every 3 years as long as the past 3 Pap tests have been normal.  Some women have medical problems that increase the chance of getting cervical cancer. Talk to your caregiver about these problems. It is especially important to talk to your caregiver if a new problem develops soon after your last Pap test. In these cases, your caregiver may recommend more frequent screening and Pap tests.  The above recommendations are the same for women who have or have not gotten the vaccine for human papillomavirus (HPV).  If you had a hysterectomy for a problem that was not cancer or a condition that could lead to cancer, then you no longer need Pap tests. Even if you no longer need a Pap test, a regular exam is a good idea to make sure no other problems are starting.  If you are between ages 37 and 39, and you have had normal Pap tests  going back 10 years, you no longer need Pap tests. Even if you no longer need a Pap test, a regular exam is a good idea to make sure no other problems are starting.  If you have had past treatment for cervical cancer or a condition that could lead to cancer, you need Pap tests and screening for cancer for at least 20 years after your treatment.  If Pap tests have been discontinued, risk factors (such as a new sexual partner) need to be reassessed to determine if screening should be resumed.  The HPV test is an additional test that  may be used for cervical cancer screening. The HPV test looks for the virus that can cause the cell changes on the cervix. The cells collected during the Pap test can be tested for HPV. The HPV test could be used to screen women aged 32 years and older, and should be used in women of any age who have unclear Pap test results. After the age of 11, women should have HPV testing at the same frequency as a Pap test.  Colorectal cancer can be detected and often prevented. Most routine colorectal cancer screening begins at the age of 1 and continues through age 87. However, your caregiver may recommend screening at an earlier age if you have risk factors for colon cancer. On a yearly basis, your caregiver may provide home test kits to check for hidden blood in the stool. Use of a small camera at the end of a tube, to directly examine the colon (sigmoidoscopy or colonoscopy), can detect the earliest forms of colorectal cancer. Talk to your caregiver about this at age 71, when routine screening begins. Direct examination of the colon should be repeated every 5 to 10 years through age 19, unless early forms of pre-cancerous polyps or small growths are found.  Hepatitis C blood testing is recommended for all people born from [birth year] through 1965 and any individual with known risks for hepatitis C.  Practice safe sex. Use condoms and avoid high-risk sexual practices to reduce the spread of sexually transmitted infections (STIs). STIs include gonorrhea, chlamydia, syphilis, trichomonas, herpes, HPV, and human immunodeficiency virus (HIV). Herpes, HIV, and HPV are viral illnesses that have no cure. They can result in disability, cancer, and death. Sexually active women aged 34 and younger should be checked for chlamydia. Older women with new or multiple partners should also be tested for chlamydia. Testing for other STIs is recommended if you are sexually active and at increased risk.  Osteoporosis is a disease in which  the bones lose minerals and strength with aging. This can result in serious bone fractures. The risk of osteoporosis can be identified using a bone density scan. Women ages 67 and over and women at risk for fractures or osteoporosis should discuss screening with their caregivers. Ask your caregiver whether you should take a calcium supplement or vitamin D to reduce the rate of osteoporosis.  Menopause can be associated with physical symptoms and risks. Hormone replacement therapy is available to decrease symptoms and risks. You should talk to your caregiver about whether hormone replacement therapy is right for you.  Use sunscreen with sun protection factor (SPF) of 30 or more. Apply sunscreen liberally and repeatedly throughout the day. You should seek shade when your shadow is shorter than you. Protect yourself by wearing long sleeves, pants, a wide-brimmed hat, and sunglasses year round, whenever you are outdoors.  Once a month, do a whole body skin exam, using a  mirror to look at the skin on your back. Notify your caregiver of new moles, moles that have irregular borders, moles that are larger than a pencil eraser, or moles that have changed in shape or color.  Stay current with required immunizations.  Influenza. You need a dose every fall (or winter). The composition of the flu vaccine changes each year, so being vaccinated once is not enough.  Pneumococcal polysaccharide. You need 1 to 2 doses if you smoke cigarettes or if you have certain chronic medical conditions. You need 1 dose at age 12 (or older) if you have never been vaccinated.  Tetanus, diphtheria, pertussis (Tdap, Td). Get 1 dose of Tdap vaccine if you are younger than age 68, are over 62 and have contact with an infant, are a Research scientist (physical sciences), are pregnant, or simply want to be protected from whooping cough. After that, you need a Td booster dose every 10 years. Consult your caregiver if you have not had at least 3 tetanus and  diphtheria-containing shots sometime in your life or have a deep or dirty wound.  HPV. You need this vaccine if you are a woman age 56 or younger. The vaccine is given in 3 doses over 6 months.  Measles, mumps, rubella (MMR). You need at least 1 dose of MMR if you were born in 1957 or later. You may also need a second dose.  Meningococcal. If you are age 8 to 57 and a first-year college student living in a residence hall, or have one of several medical conditions, you need to get vaccinated against meningococcal disease. You may also need additional booster doses.  Zoster (shingles). If you are age 22 or older, you should get this vaccine.  Varicella (chickenpox). If you have never had chickenpox or you were vaccinated but received only 1 dose, talk to your caregiver to find out if you need this vaccine.  Hepatitis A. You need this vaccine if you have a specific risk factor for hepatitis A virus infection or you simply wish to be protected from this disease. The vaccine is usually given as 2 doses, 6 to 18 months apart.  Hepatitis B. You need this vaccine if you have a specific risk factor for hepatitis B virus infection or you simply wish to be protected from this disease. The vaccine is given in 3 doses, usually over 6 months. Preventive Services / Frequency Ages 52 to 2  Blood pressure check.** / Every 1 to 2 years.  Lipid and cholesterol check.** / Every 5 years beginning at age 14.  Clinical breast exam.** / Every 3 years for women in their 38s and 30s.  Pap test.** / Every 2 years from ages 51 through 13. Every 3 years starting at age 48 through age 61 or 12 with a history of 3 consecutive normal Pap tests.  HPV screening.** / Every 3 years from ages 71 through ages 64 to 4 with a history of 3 consecutive normal Pap tests.  Hepatitis C blood test.** / For any individual with known risks for hepatitis C.  Skin self-exam. / Monthly.  Influenza immunization.** / Every  year.  Pneumococcal polysaccharide immunization.** / 1 to 2 doses if you smoke cigarettes or if you have certain chronic medical conditions.  Tetanus, diphtheria, pertussis (Tdap, Td) immunization. / A one-time dose of Tdap vaccine. After that, you need a Td booster dose every 10 years.  HPV immunization. / 3 doses over 6 months, if you are 39 and younger.  Measles,  mumps, rubella (MMR) immunization. / You need at least 1 dose of MMR if you were born in 1957 or later. You may also need a second dose.  Meningococcal immunization. / 1 dose if you are age 36 to 53 and a first-year college student living in a residence hall, or have one of several medical conditions, you need to get vaccinated against meningococcal disease. You may also need additional booster doses.  Varicella immunization.** / Consult your caregiver.  Hepatitis A immunization.** / Consult your caregiver. 2 doses, 6 to 18 months apart.  Hepatitis B immunization.** / Consult your caregiver. 3 doses usually over 6 months. Ages 25 to 38  Blood pressure check.** / Every 1 to 2 years.  Lipid and cholesterol check.** / Every 5 years beginning at age 69.  Clinical breast exam.** / Every year after age 63.  Mammogram.** / Every year beginning at age 37 and continuing for as long as you are in good health. Consult with your caregiver.  Pap test.** / Every 3 years starting at age 27 through age 71 or 21 with a history of 3 consecutive normal Pap tests.  HPV screening.** / Every 3 years from ages 23 through ages 32 to 70 with a history of 3 consecutive normal Pap tests.  Fecal occult blood test (FOBT) of stool. / Every year beginning at age 26 and continuing until age 77. You may not need to do this test if you get a colonoscopy every 10 years.  Flexible sigmoidoscopy or colonoscopy.** / Every 5 years for a flexible sigmoidoscopy or every 10 years for a colonoscopy beginning at age 3 and continuing until age 75.  Hepatitis C  blood test.** / For all people born from [birth year] through 1965 and any individual with known risks for hepatitis C.  Skin self-exam. / Monthly.  Influenza immunization.** / Every year.  Pneumococcal polysaccharide immunization.** / 1 to 2 doses if you smoke cigarettes or if you have certain chronic medical conditions.  Tetanus, diphtheria, pertussis (Tdap, Td) immunization.** / A one-time dose of Tdap vaccine. After that, you need a Td booster dose every 10 years.  Measles, mumps, rubella (MMR) immunization. / You need at least 1 dose of MMR if you were born in 1957 or later. You may also need a second dose.  Varicella immunization.** / Consult your caregiver.  Meningococcal immunization.** / Consult your caregiver.  Hepatitis A immunization.** / Consult your caregiver. 2 doses, 6 to 18 months apart.  Hepatitis B immunization.** / Consult your caregiver. 3 doses, usually over 6 months. Ages 34 and over  Blood pressure check.** / Every 1 to 2 years.  Lipid and cholesterol check.** / Every 5 years beginning at age 51.  Clinical breast exam.** / Every year after age 59.  Mammogram.** / Every year beginning at age 68 and continuing for as long as you are in good health. Consult with your caregiver.  Pap test.** / Every 3 years starting at age 1 through age 37 or 65 with a 3 consecutive normal Pap tests. Testing can be stopped between 65 and 70 with 3 consecutive normal Pap tests and no abnormal Pap or HPV tests in the past 10 years.  HPV screening.** / Every 3 years from ages 66 through ages 57 or 79 with a history of 3 consecutive normal Pap tests. Testing can be stopped between 65 and 70 with 3 consecutive normal Pap tests and no abnormal Pap or HPV tests in the past 10 years.  Fecal  occult blood test (FOBT) of stool. / Every year beginning at age 48 and continuing until age 68. You may not need to do this test if you get a colonoscopy every 10 years.  Flexible sigmoidoscopy or  colonoscopy.** / Every 5 years for a flexible sigmoidoscopy or every 10 years for a colonoscopy beginning at age 11 and continuing until age 6.  Hepatitis C blood test.** / For all people born from [birth year] through 1965 and any individual with known risks for hepatitis C.  Osteoporosis screening.** / A one-time screening for women ages 28 and over and women at risk for fractures or osteoporosis.  Skin self-exam. / Monthly.  Influenza immunization.** / Every year.  Pneumococcal polysaccharide immunization.** / 1 dose at age 68 (or older) if you have never been vaccinated.  Tetanus, diphtheria, pertussis (Tdap, Td) immunization. / A one-time dose of Tdap vaccine if you are over 65 and have contact with an infant, are a Research scientist (physical sciences), or simply want to be protected from whooping cough. After that, you need a Td booster dose every 10 years.  Varicella immunization.** / Consult your caregiver.  Meningococcal immunization.** / Consult your caregiver.  Hepatitis A immunization.** / Consult your caregiver. 2 doses, 6 to 18 months apart.  Hepatitis B immunization.** / Check with your caregiver. 3 doses, usually over 6 months. ** Family history and personal history of risk and conditions may change your caregiver's recommendations. Document Released: 05/27/2001 Document Revised: 06/23/2011 Document Reviewed: 08/26/2010 [facility] Patient Information 2014 [place], [place].     [name]. Joeann Steppe M.D.

## 2013-04-21 ENCOUNTER — Telehealth: Payer: Self-pay | Admitting: Family Medicine

## 2013-04-21 NOTE — Telephone Encounter (Signed)
CPE 12/17/2012.  Please advise.  Thanks!

## 2013-04-21 NOTE — Telephone Encounter (Signed)
Patient's mother called and left a voice message on my machine. Needs a refill of Adderall.  

## 2013-04-22 ENCOUNTER — Encounter: Payer: Self-pay | Admitting: Family Medicine

## 2013-04-22 MED ORDER — AMPHETAMINE-DEXTROAMPHETAMINE 10 MG PO TABS: ORAL_TABLET | ORAL | Status: DC

## 2013-04-22 MED ORDER — AMPHETAMINE-DEXTROAMPHET ER 20 MG PO CP24: 20.0000 mg | ORAL_CAPSULE | ORAL | Status: DC

## 2013-04-22 NOTE — Telephone Encounter (Signed)
Refill given Patient due for an appointment in March or so Needs contract signed

## 2013-06-29 ENCOUNTER — Telehealth: Payer: Self-pay | Admitting: Internal Medicine

## 2013-06-29 NOTE — Telephone Encounter (Signed)
[facility] Triage Call Report Triage Record Num: [id number] Operator: Terance Icericia Trull Patient Name: Amanda GrainKathryn Bleau Call Date & Time: 06/28/2013 7:27:55PM Patient Phone: [phone] PCP: Neta MendsWanda K. [name] Patient Gender: Female PCP Fax : [phone] Patient DOB: 06/15/1990 [facility] Reason for Call: Caller: Marlo/Patient; PCP: Berniece AndreasPanosh, Wanda (Family Practice); CB#: [phone]; Call regarding Cough/Congestion onset 06/25/13; tired, headache, dry cough; sore throat with slight difficulty swallowing onset 06/28/13; LMP: 06/27/13; afebrile; aching in neck area; Guideline: Sore Throat or Hoarseness with disposition of see provider within 24 hours for new onset of painful, swollen glands on sides of neck or under jaw; Appt? no acute appts available in EPIC with provider - will call back next day. Protocol(s) Used: Sore Throat or Hoarseness Recommended Outcome per Protocol: See Provider within 24 hours Reason for Outcome: New onset of painful, swollen glands on sides of neck or under jaw Care Advice: ~ Swollen lymph glands that persist more than two weeks must be evaluated by provider. Call provider if any of the following signs and symptoms develop: severe sore throat, enlarged tonsils with white or yellow patches, tender swollen glands, fever, generally feel ill, persistent low grade headache, or abdominal pain. ~ Drink more fluids -- water, low-sugar juices, tea and warm soup, especially chicken broth, are options. Avoid caffeinated or alcoholic beverages because they can increase the chance of dehydration. ~ ~ SYMPTOM / CONDITION MANAGEMENT ~ CAUTIONS Sore Throat Relief: - Use salt water gargles (1/2 teaspoon salt in 8 oz. [25640mL] warm water) every one to two hours. - Use a vaporizer or cool mist humidifier in the room when sleeping. - Suck on hard candy, nonprescription or herbal throat lozenges (sugar-free if diabetic) - Eat soothing, soft food/fluids (broths,  soups, or honey and lemon juice in hot tea, Popsicles, frozen yogurt or sherbet, scrambled eggs, cooked cereals, Jell-O or puddings) whichever is most comforting. - Avoid eating salty, spicy or acidic foods. ~ 03/

## 2013-07-04 ENCOUNTER — Ambulatory Visit (INDEPENDENT_AMBULATORY_CARE_PROVIDER_SITE_OTHER): Payer: 59 | Admitting: Internal Medicine

## 2013-07-04 ENCOUNTER — Encounter: Payer: Self-pay | Admitting: Internal Medicine

## 2013-07-04 VITALS — BP 140/74 | Temp 98.2°F | Ht 70.0 in | Wt 150.0 lb

## 2013-07-04 DIAGNOSIS — J039 Acute tonsillitis, unspecified: Secondary | ICD-10-CM

## 2013-07-04 LAB — POCT RAPID STREP A (OFFICE): Rapid Strep A Screen: NEGATIVE

## 2013-07-04 MED ORDER — AMOXICILLIN 500 MG PO CAPS: 500.0000 mg | ORAL_CAPSULE | Freq: Three times a day (TID) | ORAL | Status: DC

## 2013-07-04 NOTE — Progress Notes (Signed)
Chief Complaint  Patient presents with  . Sore Throat    Started last weekend.  . Chills  . Generalized Body Aches  . Fatigue  . Headache    HPI: Patient comes in today for SDA for  new problem evaluation. Onset  A week ago feverish and mild cough and then sore throat  Worsening   And pus on back of throat and headaches .   No sig fever .  Using Advil dn tylenol  ocass chills .   Better with meds  continuing swollen throat and tender glands . Had mono in HS.  ROS: See pertinent positives and negatives per HPI. No high fever rash V,D exposures except to flu like illnesses that others recovered  hasdnt had a sig sore throat in a while  But past hx of recurrent st.   Past Medical History  Diagnosis Date  . Exercise-induced asthma   . GERD (gastroesophageal reflux disease)     does well on ranitidine endo 2010  . Exercise-induced asthma   . Acne   . Attention deficit     [facility]  psychology in past  . Hx of varicella   . History of infectious mononucleosis 10/06  . Pharyngitis 11/09/2011    With acute on history of chronic sinusitis  . Frontal sinusitis 07/21/2012  . Dysmenorrhea 11/29/2007    Qualifier: Diagnosis of  By: Fabian Sharp MD, [name]     Family History  Problem Relation Age of Onset  . ADD / ADHD Sister     History   Social History  . Marital Status: Single    Spouse Name: N/A    Number of Children: N/A  . Years of Education: N/A   Social History Main Topics  . Smoking status: Never Smoker   . Smokeless tobacco: None  . Alcohol Use: None  . Drug Use: None  . Sexual Activity: None   Other Topics Concern  . None   Social History Narrative   [employer]  Good grades minor in psychology major exercise sports physiology senior      NOn smoker           Outpatient Encounter Prescriptions as of 07/04/2013  Medication Sig  . albuterol (PROAIR HFA) 108 (90 BASE) MCG/ACT inhaler Inhale 2 puffs into the lungs every 6 (six) hours as needed.  Marland Kitchen  amphetamine-dextroamphetamine (ADDERALL XR) 20 MG 24 hr capsule Take 1 capsule (20 mg total) by mouth every morning.  Marland Kitchen amphetamine-dextroamphetamine (ADDERALL) 10 MG tablet 1/2 -1 po add on for homework  . mupirocin nasal ointment (BACTROBAN) 2 % Place into the nose 2 (two) times daily. Use one-half of tube in each nostril twice daily for five (5) days. After application, press sides of nose together and gently massage.  . norethindrone-ethinyl estradiol (LOESTRIN 1/20, 21,) 1-20 MG-MCG tablet Take 1 tablet by mouth daily.  . ranitidine (ZANTAC) 300 MG tablet Take 1 tablet (300 mg total) by mouth 2 (two) times daily.  Marland Kitchen amoxicillin (AMOXIL) 500 MG capsule Take 1 capsule (500 mg total) by mouth 3 (three) times daily.  . cetirizine (ZYRTEC) 10 MG tablet Take 1 tablet (10 mg total) by mouth 2 (two) times daily.    EXAM:  BP 140/74  Temp(Src) 98.2 F (36.8 C) (Oral)  Ht 5\' 10"  (1.778 m)  Wt 150 lb (68.04 kg)  BMI 21.52 kg/m2  Body mass index is 21.52 kg/(m^2).  GENERAL: vitals reviewed and listed above, alert, oriented, appears well hydrated  and in no acute distress mildly ill  HEENT: atraumatic, conjunctiva  clear, no obvious abnormalities on inspection of external nose and ears OP : 1+ red large exudate right more than left airway good no edema  NECK: no obvious masses on inspection tender 1-2+ ac nodes neg pc nodes  LUNGS: clear to auscultation bilaterally, no wheezes, rales or rhonchi, good air movement CV: HRRR, no clubbing cyanosis or  peripheral edema nl cap refill  Abdomen:  Sof,t normal bowel sounds without hepatosplenomegaly, no guarding rebound or masses no CVA tenderness MS: moves all extremities without noticeable focal  Abnormality Skin nl cap refill no rash PSYCH: pleasant and cooperative, no obvious depression or anxiety  ASSESSMENT AND PLAN:  Discussed the following assessment and plan:  Tonsillitis with exudate - right  develop late in what acts like a viral respt  infection /hx of mono in past . good airway TC empiric rx for now  - Plan: Throat culture Loney Loh), POC Rapid Strep A  -Patient advised to return or notify health care team  if symptoms worsen ,persist or new concerns arise.  Patient Instructions  Rapid  strep is neg culture pending , will contact  you with results . Begin antibiotic  . Gargles etc . Fu if  persistent or progressive either way .  Tonsillitis Tonsillitis is an infection of the throat that causes the tonsils to become red, tender, and swollen. Tonsils are collections of lymphoid tissue at the back of the throat. Each tonsil has crevices (crypts). Tonsils help fight nose and throat infections and keep infection from spreading to other parts of the body for the first 18 months of life.  CAUSES Sudden (acute) tonsillitis is usually caused by infection with streptococcal bacteria. Long-lasting (chronic) tonsillitis occurs when the crypts of the tonsils become filled with pieces of food and bacteria, which makes it easy for the tonsils to become repeatedly infected. SYMPTOMS  Symptoms of tonsillitis include:  A sore throat, with possible difficulty swallowing.  White patches on the tonsils.  Fever.  Tiredness.  New episodes of snoring during sleep, when you did not snore before.  Small, foul-smelling, yellowish-white pieces of material (tonsilloliths) that you occasionally cough up or spit out. The tonsilloliths can also cause you to have bad breath. DIAGNOSIS Tonsillitis can be diagnosed through a physical exam. Diagnosis can be confirmed with the results of lab tests, including a throat culture. TREATMENT  The goals of tonsillitis treatment include the reduction of the severity and duration of symptoms and prevention of associated conditions. Symptoms of tonsillitis can be improved with the use of steroids to reduce the swelling. Tonsillitis caused by bacteria can be treated with antibiotics. Usually, treatment with  antibiotics is started before the cause of the tonsillitis is known. However, if it is determined that the cause is not bacterial, antibiotics will not treat the tonsillitis. If attacks of tonsillitis are severe and frequent, your caregiver may recommend surgery to remove the tonsils (tonsillectomy). HOME CARE INSTRUCTIONS   Rest as much as possible and get plenty of sleep.  Drink plenty of fluids. While the throat is very sore, eat soft foods or liquids, such as sherbet, soups, or instant breakfast drinks.  Eat frozen ice pops.  Gargle with a warm or cold liquid to help soothe the throat. Mix 1/4 teaspoon of salt and 1/4 teaspoon of baking soda in in 8 oz of water. SEEK MEDICAL CARE IF:   Large, tender lumps develop in your neck.  A rash develops.  A green, yellow-brown, or bloody substance is coughed up.  You are unable to swallow liquids or food for 24 hours.  You notice that only one of the tonsils is swollen. SEEK IMMEDIATE MEDICAL CARE IF:   You develop any new symptoms such as vomiting, severe headache, stiff neck, chest pain, or trouble breathing or swallowing.  You have severe throat pain along with drooling or voice changes.  You have severe pain, unrelieved with recommended medications.  You are unable to fully open the mouth.  You develop redness, swelling, or severe pain anywhere in the neck.  You have a fever. MAKE SURE YOU:   Understand these instructions.  Will watch your condition.  Will get help right away if you are not doing well or get worse. Document Released: 01/08/2005 Document Revised: 12/01/2012 Document Reviewed: 09/17/2012 [facility] Patient Information 2014 [place], [place].         Neta MendsWanda K. Catheryne Deford M.D.  Pre visit review using our clinic review tool, if applicable. No additional management support is needed unless otherwise documented below in the visit note.

## 2013-07-04 NOTE — Patient Instructions (Signed)
Rapid  strep is neg culture pending , will contact  you with results . Begin antibiotic  . Gargles etc . Fu if  persistent or progressive either way .  Tonsillitis Tonsillitis is an infection of the throat that causes the tonsils to become red, tender, and swollen. Tonsils are collections of lymphoid tissue at the back of the throat. Each tonsil has crevices (crypts). Tonsils help fight nose and throat infections and keep infection from spreading to other parts of the body for the first 18 months of life.  CAUSES Sudden (acute) tonsillitis is usually caused by infection with streptococcal bacteria. Long-lasting (chronic) tonsillitis occurs when the crypts of the tonsils become filled with pieces of food and bacteria, which makes it easy for the tonsils to become repeatedly infected. SYMPTOMS  Symptoms of tonsillitis include:  A sore throat, with possible difficulty swallowing.  White patches on the tonsils.  Fever.  Tiredness.  New episodes of snoring during sleep, when you did not snore before.  Small, foul-smelling, yellowish-white pieces of material (tonsilloliths) that you occasionally cough up or spit out. The tonsilloliths can also cause you to have bad breath. DIAGNOSIS Tonsillitis can be diagnosed through a physical exam. Diagnosis can be confirmed with the results of lab tests, including a throat culture. TREATMENT  The goals of tonsillitis treatment include the reduction of the severity and duration of symptoms and prevention of associated conditions. Symptoms of tonsillitis can be improved with the use of steroids to reduce the swelling. Tonsillitis caused by bacteria can be treated with antibiotics. Usually, treatment with antibiotics is started before the cause of the tonsillitis is known. However, if it is determined that the cause is not bacterial, antibiotics will not treat the tonsillitis. If attacks of tonsillitis are severe and frequent, your caregiver may recommend  surgery to remove the tonsils (tonsillectomy). HOME CARE INSTRUCTIONS   Rest as much as possible and get plenty of sleep.  Drink plenty of fluids. While the throat is very sore, eat soft foods or liquids, such as sherbet, soups, or instant breakfast drinks.  Eat frozen ice pops.  Gargle with a warm or cold liquid to help soothe the throat. Mix 1/4 teaspoon of salt and 1/4 teaspoon of baking soda in in 8 oz of water. SEEK MEDICAL CARE IF:   Large, tender lumps develop in your neck.  A rash develops.  A green, yellow-brown, or bloody substance is coughed up.  You are unable to swallow liquids or food for 24 hours.  You notice that only one of the tonsils is swollen. SEEK IMMEDIATE MEDICAL CARE IF:   You develop any new symptoms such as vomiting, severe headache, stiff neck, chest pain, or trouble breathing or swallowing.  You have severe throat pain along with drooling or voice changes.  You have severe pain, unrelieved with recommended medications.  You are unable to fully open the mouth.  You develop redness, swelling, or severe pain anywhere in the neck.  You have a fever. MAKE SURE YOU:   Understand these instructions.  Will watch your condition.  Will get help right away if you are not doing well or get worse. Document Released: 01/08/2005 Document Revised: 12/01/2012 Document Reviewed: 09/17/2012 [facility] Patient Information 2014 [place], [place].

## 2013-07-07 LAB — CULTURE, GROUP A STREP

## 2013-07-19 ENCOUNTER — Encounter: Payer: Self-pay | Admitting: Internal Medicine

## 2013-07-19 ENCOUNTER — Ambulatory Visit (INDEPENDENT_AMBULATORY_CARE_PROVIDER_SITE_OTHER): Payer: 59 | Admitting: Internal Medicine

## 2013-07-19 VITALS — BP 130/90 | Temp 98.5°F | Ht 70.0 in | Wt 151.0 lb

## 2013-07-19 DIAGNOSIS — Z299 Encounter for prophylactic measures, unspecified: Secondary | ICD-10-CM

## 2013-07-19 DIAGNOSIS — J45990 Exercise induced bronchospasm: Secondary | ICD-10-CM

## 2013-07-19 DIAGNOSIS — J039 Acute tonsillitis, unspecified: Secondary | ICD-10-CM

## 2013-07-19 DIAGNOSIS — R030 Elevated blood-pressure reading, without diagnosis of hypertension: Secondary | ICD-10-CM

## 2013-07-19 DIAGNOSIS — K219 Gastro-esophageal reflux disease without esophagitis: Secondary | ICD-10-CM

## 2013-07-19 DIAGNOSIS — F988 Other specified behavioral and emotional disorders with onset usually occurring in childhood and adolescence: Secondary | ICD-10-CM

## 2013-07-19 DIAGNOSIS — Z79899 Other long term (current) drug therapy: Secondary | ICD-10-CM

## 2013-07-19 DIAGNOSIS — IMO0001 Reserved for inherently not codable concepts without codable children: Secondary | ICD-10-CM | POA: Insufficient documentation

## 2013-07-19 DIAGNOSIS — Z23 Encounter for immunization: Secondary | ICD-10-CM

## 2013-07-19 DIAGNOSIS — Z111 Encounter for screening for respiratory tuberculosis: Secondary | ICD-10-CM

## 2013-07-19 DIAGNOSIS — N946 Dysmenorrhea, unspecified: Secondary | ICD-10-CM

## 2013-07-19 MED ORDER — ALBUTEROL SULFATE HFA 108 (90 BASE) MCG/ACT IN AERS: 2.0000 | INHALATION_SPRAY | Freq: Four times a day (QID) | RESPIRATORY_TRACT | Status: DC | PRN

## 2013-07-19 MED ORDER — AMPHETAMINE-DEXTROAMPHETAMINE 10 MG PO TABS: ORAL_TABLET | ORAL | Status: DC

## 2013-07-19 MED ORDER — RANITIDINE HCL 300 MG PO TABS: 300.0000 mg | ORAL_TABLET | Freq: Two times a day (BID) | ORAL | Status: DC

## 2013-07-19 MED ORDER — NORETHINDRONE ACET-ETHINYL EST 1-20 MG-MCG PO TABS: 1.0000 | ORAL_TABLET | Freq: Every day | ORAL | Status: DC

## 2013-07-19 MED ORDER — AMPHETAMINE-DEXTROAMPHET ER 20 MG PO CP24: 20.0000 mg | ORAL_CAPSULE | ORAL | Status: DC

## 2013-07-19 NOTE — Patient Instructions (Signed)
Check bp readings  At home or out of medical   lfacility  Will notify you  of labs when available. Proceed with getting  Your pap smear.  ROV in 6 months    Or about

## 2013-07-19 NOTE — Progress Notes (Signed)
Chief Complaint  Patient presents with  . Follow-up    Med Check    HPI: Fu medicatoin management and tonsillitis non grp a beta h strep  1 multiple medical issues refills. She is going to physical therapy school in [place] and has forms to sign documenting immunizations needs a two-step PPD and hepatitis B titer quantitative. ADD:  Taking extended release every day   Working well not using the 10 immediate release Adderall on for studying When not in school  Did this when she starts school in May. Refill   Ok .    Continuous therapy June now better than the other medicines needs refill Periods  Lighter. Schedule for Pap smear soon Asthma  Temperature s in aggravate using inhaler preexercise nasacort     Helping with allergies .,  Continue.  Sleep  Ok except with stress. Does not feel medications are affecting this in a negative way Needs refill on her ranitidine controls her reflux. Takes twice a day. ROS: See pertinent positives and negatives per HPI. No chest pain shortness of breath syncope depression anxiety attacks some stress but controlled.  Past Medical History  Diagnosis Date  . Exercise-induced asthma   . GERD (gastroesophageal reflux disease)     does well on ranitidine endo 2010  . Exercise-induced asthma   . Acne   . Attention deficit     [facility]  psychology in past  . Hx of varicella   . History of infectious mononucleosis 10/06  . Pharyngitis 11/09/2011    With acute on history of chronic sinusitis  . Frontal sinusitis 07/21/2012  . Dysmenorrhea 11/29/2007    Qualifier: Diagnosis of  By: Fabian Sharp MD, [name]     Family History  Problem Relation Age of Onset  . ADD / ADHD Sister     History   Social History  . Marital Status: Single    Spouse Name: N/A    Number of Children: N/A  . Years of Education: N/A   Social History Main Topics  . Smoking status: Never Smoker   . Smokeless tobacco: None  . Alcohol Use: None  . Drug Use:  None  . Sexual Activity: None   Other Topics Concern  . None   Social History Narrative   [employer]  Good grades minor in psychology major exercise sports physiology senior      NOn smoker    To go to PT school [employer] 2015             Outpatient Encounter Prescriptions as of 07/19/2013  Medication Sig  . albuterol (PROAIR HFA) 108 (90 BASE) MCG/ACT inhaler Inhale 2 puffs into the lungs every 6 (six) hours as needed.  Marland Kitchen amphetamine-dextroamphetamine (ADDERALL XR) 20 MG 24 hr capsule Take 1 capsule (20 mg total) by mouth every morning.  Marland Kitchen amphetamine-dextroamphetamine (ADDERALL) 10 MG tablet 1/2 -1 po add on for homework  . loratadine (CLARITIN) 10 MG tablet Take 10 mg by mouth daily as needed for allergies.  Marland Kitchen norethindrone-ethinyl estradiol (LOESTRIN 1/20, 21,) 1-20 MG-MCG tablet Take 1 tablet by mouth daily.  . ranitidine (ZANTAC) 300 MG tablet Take 1 tablet (300 mg total) by mouth 2 (two) times daily.  . [DISCONTINUED] albuterol (PROAIR HFA) 108 (90 BASE) MCG/ACT inhaler Inhale 2 puffs into the lungs every 6 (six) hours as needed.  . [DISCONTINUED] amphetamine-dextroamphetamine (ADDERALL XR) 20 MG 24 hr capsule Take 1 capsule (20 mg total) by mouth  every morning.  . [DISCONTINUED] amphetamine-dextroamphetamine (ADDERALL) 10 MG tablet 1/2 -1 po add on for homework  . [DISCONTINUED] norethindrone-ethinyl estradiol (LOESTRIN 1/20, 21,) 1-20 MG-MCG tablet Take 1 tablet by mouth daily.  . [DISCONTINUED] ranitidine (ZANTAC) 300 MG tablet Take 1 tablet (300 mg total) by mouth 2 (two) times daily.  . [DISCONTINUED] amoxicillin (AMOXIL) 500 MG capsule Take 1 capsule (500 mg total) by mouth 3 (three) times daily.  . [DISCONTINUED] cetirizine (ZYRTEC) 10 MG tablet Take 1 tablet (10 mg total) by mouth 2 (two) times daily.  . [DISCONTINUED] mupirocin nasal ointment (BACTROBAN) 2 % Place into the nose 2 (two) times daily. Use one-half of tube in each nostril twice daily for five  (5) days. After application, press sides of nose together and gently massage.    EXAM:  BP 130/90  Temp(Src) 98.5 F (36.9 C) (Oral)  Ht 5\' 10"  (1.778 m)  Wt 151 lb (68.493 kg)  BMI 21.67 kg/m2  Body mass index is 21.67 kg/(m^2).  GENERAL: vitals reviewed and listed above, alert, oriented, appears well hydrated and in no acute distress HEENT: atraumatic, conjunctiva  clear, no obvious abnormalities on inspection of external nose and ears OP : no lesion edema or exudate looks normal NECK: no obvious masses on inspection palpation no adenopathy LUNGS: clear to auscultation bilaterally, no wheezes, rales or rhonchi, good air movement CV: HRRR, no clubbing cyanosis or  peripheral edema nl cap refill  Abdomen soft without organomegaly guarding or rebound MS: moves all extremities without noticeable focal  abnormality PSYCH: pleasant and cooperative, no obvious depression or anxiety  ASSESSMENT AND PLAN:  Discussed the following assessment and plan:  Attention deficit disorder - Going back to school physical therapy continue extended release add on for homework as needed. Has done well with this  Medication management - Nice significant side effects such as mood heart GI sleep  Tonsillitis with exudate - Non-group A strep much better 24 hours after beginning antibiotic  Preventive measure - Plan: Hepatitis B surface antibody  Need for Tdap vaccination - Plan: Tdap vaccine greater than or equal to 7yo IM  Screening-pulmonary TB - Plan: PPD  Elevated BP - Borderline reading felt to be from anxiety in the office discuss getting readings when she's not in a medical setting. Patient states she can feel blood pressur  Dysmenorrhea - Continue continuous therapy OCPs beginning a Pap for GYN soon  ASTHMA, EXERCISE INDUCED  GERD Forms signed tdap. Refill meds for 3 months  Mom can pick up rx in July  [place]   -Patient advised to return or notify health care  team  if symptoms worsen ,persist or new concerns arise.  Patient Instructions  Check bp readings  At home or out of medical   lfacility  Will notify you  of labs when available. Proceed with getting  Your pap smear.  ROV in 6 months    Or about     Neta MendsWanda K. Cherity Blickenstaff M.D.  Pre visit review using our clinic review tool, if applicable. No additional management support is needed unless otherwise documented below in the visit note.

## 2013-07-19 NOTE — Assessment & Plan Note (Signed)
Continued plan albuterol preexercise if needed

## 2013-07-20 LAB — HEPATITIS B SURFACE ANTIBODY, QUANTITATIVE: Hep B S AB Quant (Post): 30.9 m[IU]/mL

## 2013-07-22 LAB — TB SKIN TEST: TB Skin Test: NEGATIVE

## 2013-07-29 ENCOUNTER — Ambulatory Visit (INDEPENDENT_AMBULATORY_CARE_PROVIDER_SITE_OTHER): Payer: 59 | Admitting: Family Medicine

## 2013-07-29 DIAGNOSIS — Z111 Encounter for screening for respiratory tuberculosis: Secondary | ICD-10-CM

## 2013-08-01 LAB — TB SKIN TEST
Induration: 0 mm
TB SKIN TEST: NEGATIVE

## 2013-08-11 ENCOUNTER — Ambulatory Visit (INDEPENDENT_AMBULATORY_CARE_PROVIDER_SITE_OTHER): Payer: 59 | Admitting: Gynecology

## 2013-08-11 ENCOUNTER — Other Ambulatory Visit (HOSPITAL_COMMUNITY)
Admission: RE | Admit: 2013-08-11 | Discharge: 2013-08-11 | Disposition: A | Discharge status: Home / Self Care | Payer: 59 | Source: Ambulatory Visit | Attending: Gynecology | Admitting: Gynecology

## 2013-08-11 ENCOUNTER — Encounter: Payer: Self-pay | Admitting: Gynecology

## 2013-08-11 VITALS — BP 128/84 | Ht 69.5 in | Wt 143.0 lb

## 2013-08-11 DIAGNOSIS — Z113 Encounter for screening for infections with a predominantly sexual mode of transmission: Secondary | ICD-10-CM

## 2013-08-11 DIAGNOSIS — Z01419 Encounter for gynecological examination (general) (routine) without abnormal findings: Secondary | ICD-10-CM

## 2013-08-11 NOTE — Progress Notes (Signed)
    Amanda MinksKathryn V Garrison 06/11/1990 [id number]   History:    23 y.o.  for annual exam who is a new patient to the practice. Patient would know prior Pap smear. Patient currently not sexually active. Patient on continuous oral contraceptive pill and withdraws every 3 months and has done well. Her PCP Dr. Fabian SharpPanosh has been doing her blood work. Patient has completed the HPV vaccine in the past.   Past medical history,surgical history, family history and social history were all reviewed and documented in the EPIC chart.  Gynecologic History Patient's last menstrual period was 06/28/2013. Contraception: OCP (estrogen/progesterone) Last Pap:No prior. Results were:no prior study Last mammogram: not indicated. Results were: not indicated  Obstetric History OB History  No data available     ROS: A ROS was performed and pertinent positives and negatives are included in the history.  GENERAL: No fevers or chills. HEENT: No change in vision, no earache, sore throat or sinus congestion. NECK: No pain or stiffness. CARDIOVASCULAR: No chest pain or pressure. No palpitations. PULMONARY: No shortness of breath, cough or wheeze. GASTROINTESTINAL: No abdominal pain, nausea, vomiting or diarrhea, melena or bright red blood per rectum. GENITOURINARY: No urinary frequency, urgency, hesitancy or dysuria. MUSCULOSKELETAL: No joint or muscle pain, no back pain, no recent trauma. DERMATOLOGIC: No rash, no itching, no lesions. ENDOCRINE: No polyuria, polydipsia, no heat or cold intolerance. No recent change in weight. HEMATOLOGICAL: No anemia or easy bruising or bleeding. NEUROLOGIC: No headache, seizures, numbness, tingling or weakness. PSYCHIATRIC: No depression, no loss of interest in normal activity or change in sleep pattern.     Exam: chaperone present  BP 128/84  Ht 5' 9.5" (1.765 m)  Wt 143 lb (64.864 kg)  BMI 20.82 kg/m2  LMP 06/28/2013  Body mass index is 20.82 kg/(m^2).  General appearance : Well  developed well nourished female. No acute distress HEENT: Neck supple, trachea midline, no carotid bruits, no thyroidmegaly Lungs: Clear to auscultation, no rhonchi or wheezes, or rib retractions  Heart: Regular rate and rhythm, no murmurs or gallops Breast:Examined in sitting and supine position were symmetrical in appearance, no palpable masses or tenderness,  no skin retraction, no nipple inversion, no nipple discharge, no skin discoloration, no axillary or supraclavicular lymphadenopathy Abdomen: no palpable masses or tenderness, no rebound or guarding Extremities: no edema or skin discoloration or tenderness  Pelvic:  Bartholin, Urethra, Skene Glands: Within normal limits             Vagina: No gross lesions or discharge  Cervix: No gross lesions or discharge  Uterus  AV, normal size, shape and consistency, non-tender and mobile  Adnexa  Without masses or tenderness  Anus and perineum  normal   Rectovaginal  normal sphincter tone without palpated masses or tenderness             Hemoccult not indicated     Assessment/Plan:  23 y.o. female for annual exam with first pap smear today without HPV screen per guidelines. GC/Chlamydia culture obtained. Patient reminded on monthly breast exam. PCP drew her labs recently.  Note: This dictation was prepared with  Dragon/digital dictation along withSmart phrase technology. Any transcriptional errors that result from this process are unintentional.   Ok EdwardsJuan H Jeovani Weisenburger MD, 12:21 PM 08/11/2013

## 2013-08-11 NOTE — Patient Instructions (Signed)

## 2013-08-11 NOTE — Addendum Note (Signed)
Addended by: Bertram SavinGONZALEZ-Tallyn Holroyd A on: 08/11/2013 02:41 PM   Modules accepted: Orders

## 2013-08-12 LAB — GC/CHLAMYDIA PROBE AMP
CT Probe RNA: NEGATIVE
GC Probe RNA: NEGATIVE

## 2013-10-03 ENCOUNTER — Telehealth: Payer: Self-pay | Admitting: Family Medicine

## 2013-10-03 NOTE — Telephone Encounter (Signed)
Ok 90 # Of adderall 20 xr  50 #of ir adderall 10 mg  Ask patient mom to make sure her blood pressure is good   Was up some at last visit. Get on the sched for her wellness visit/ med evaluation in the fall  ( ? Fall break)

## 2013-10-03 NOTE — Telephone Encounter (Signed)
Mom would like refills of both medications.  [facility] Please advise. Thanks!

## 2013-10-04 MED ORDER — AMPHETAMINE-DEXTROAMPHETAMINE 10 MG PO TABS: ORAL_TABLET | ORAL | Status: DC

## 2013-10-04 MED ORDER — AMPHETAMINE-DEXTROAMPHET ER 20 MG PO CP24: 20.0000 mg | ORAL_CAPSULE | ORAL | Status: DC

## 2013-10-04 NOTE — Telephone Encounter (Signed)
Left a message for a return call.

## 2013-10-07 ENCOUNTER — Other Ambulatory Visit: Payer: Self-pay | Admitting: Internal Medicine

## 2013-10-07 NOTE — Telephone Encounter (Signed)
Spoke to the patient's mom.  She will come by to pick up prescriptions. Mom reports she has recently seen her gyn and her blood pressure was recorded as normal.

## 2013-11-28 ENCOUNTER — Ambulatory Visit: Payer: 59 | Admitting: Internal Medicine

## 2013-12-26 IMAGING — CT CT ABD-PELV W/O CM
2 of 4 series · 17 of 46 positions shown, 19 images · non-contrast
Comparison: None.

CLINICAL DATA: Left flank pain for 1 week with nausea and vomiting.
Constipation.  Question ureteral calculus.

CT ABDOMEN AND PELVIS WITHOUT CONTRAST
TECHNIQUE: Multidetector CT imaging of the abdomen and pelvis was
performed following the standard protocol without intravenous
contrast.

[Series 2: stone under 200# w/ prev · axial · 0.70mm/px · z∈[-528,-148]mm · 14 of 84 slices shown, 16 images]
[im 4/84  soft-tissue]
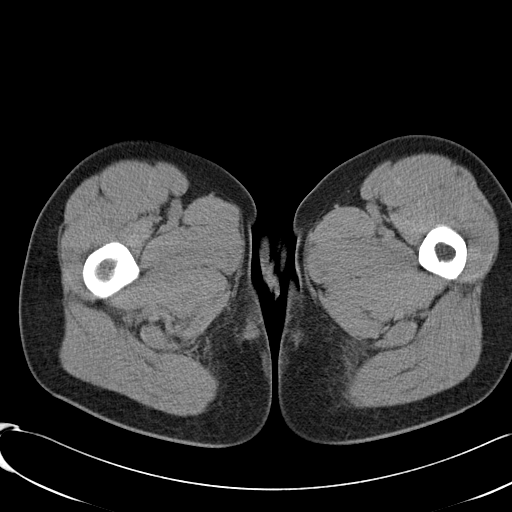
[im 4/84  bone]
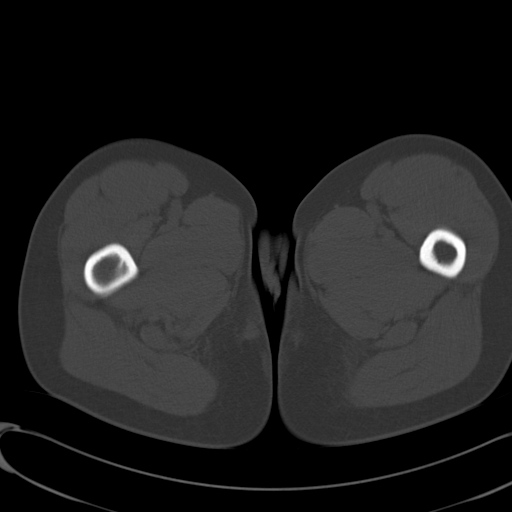
[im 10/84  soft-tissue]
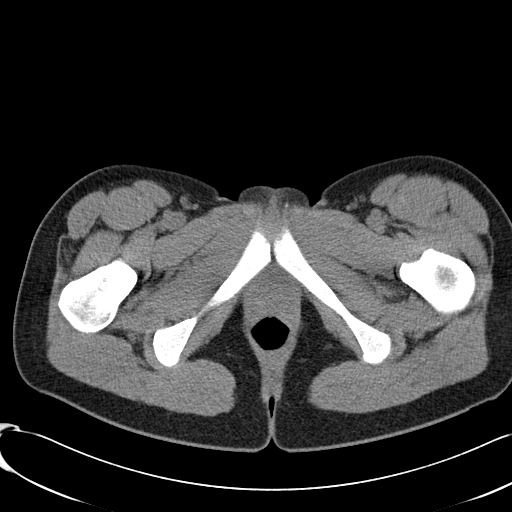
[im 16/84  soft-tissue]
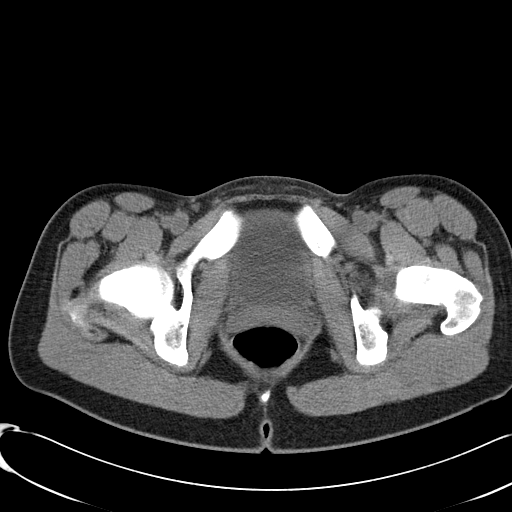
[im 22/84  soft-tissue]
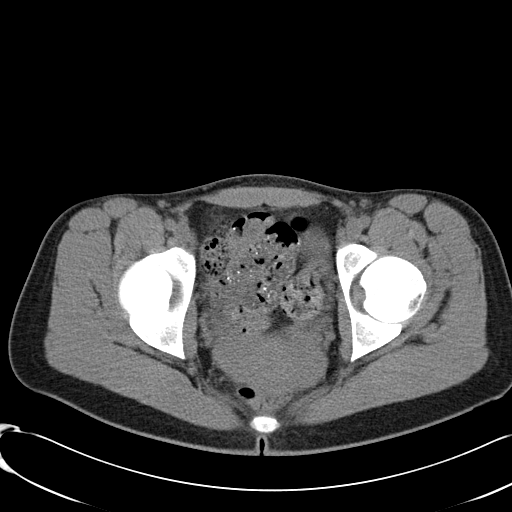
[im 28/84  soft-tissue]
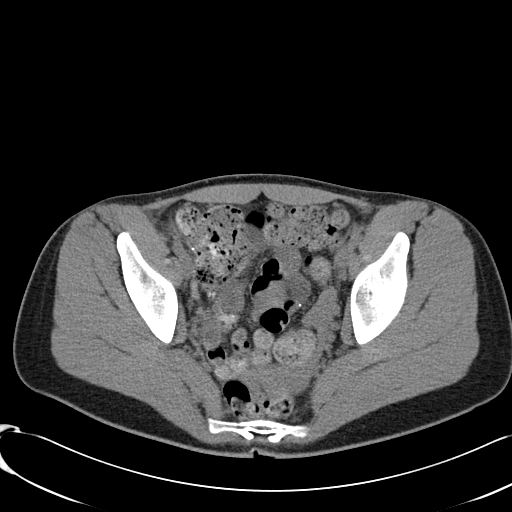
[im 34/84  soft-tissue]
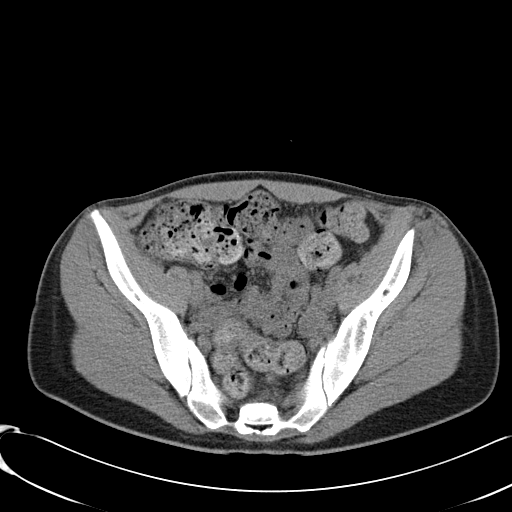
[im 40/84  soft-tissue]
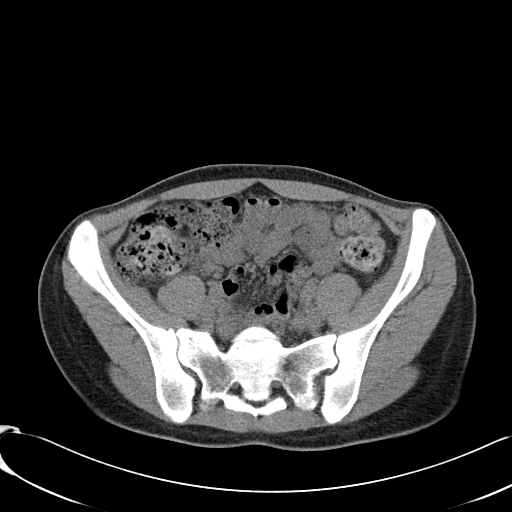
[im 44/84  soft-tissue]
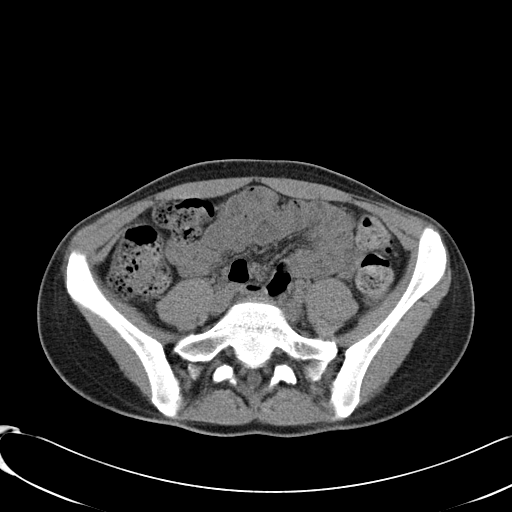
[im 50/84  soft-tissue]
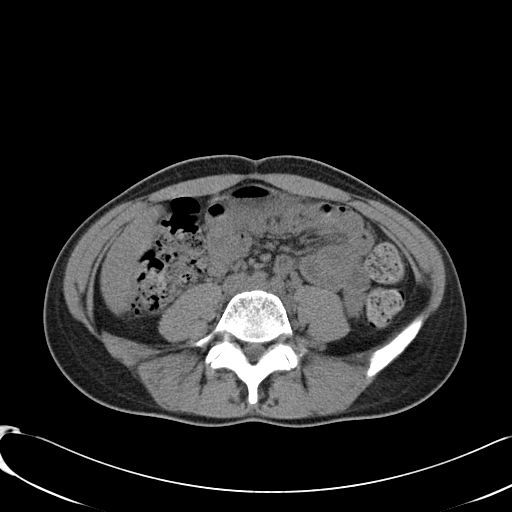
[im 50/84  bone]
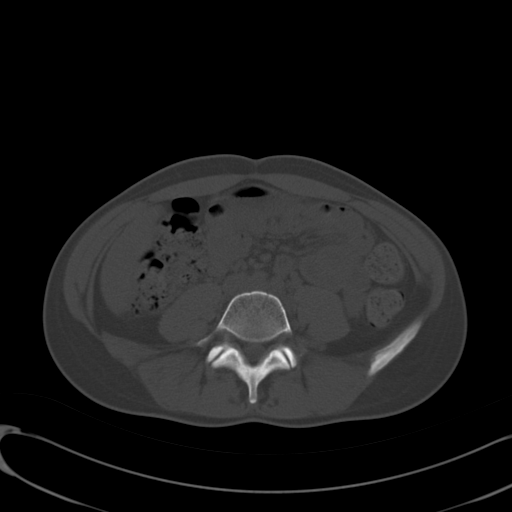
[im 56/84  soft-tissue]
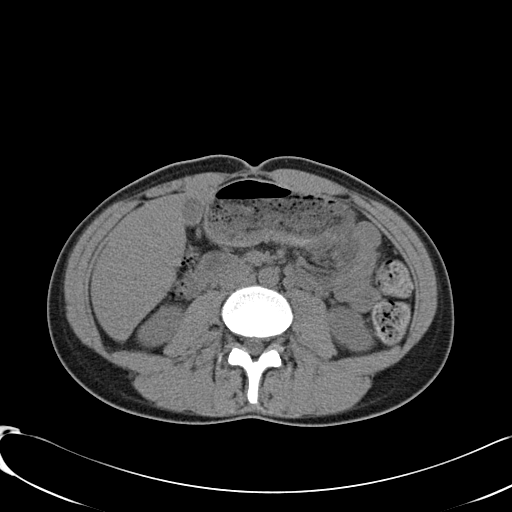
[im 62/84  soft-tissue]
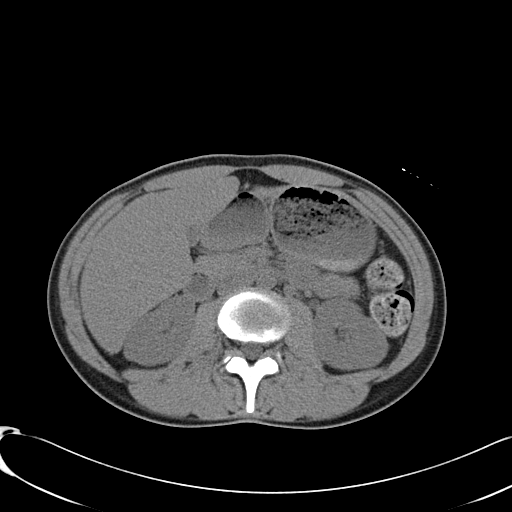
[im 68/84  soft-tissue]
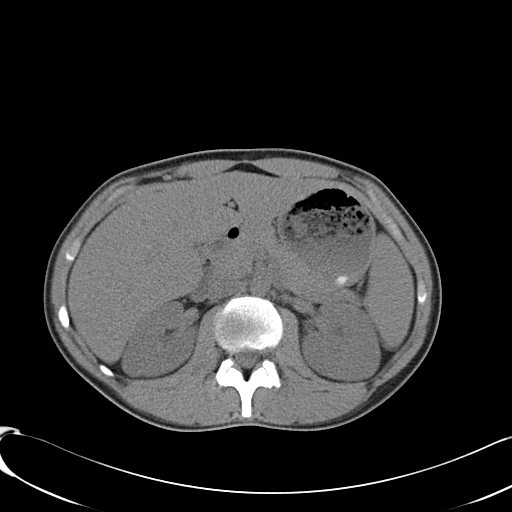
[im 74/84  soft-tissue]
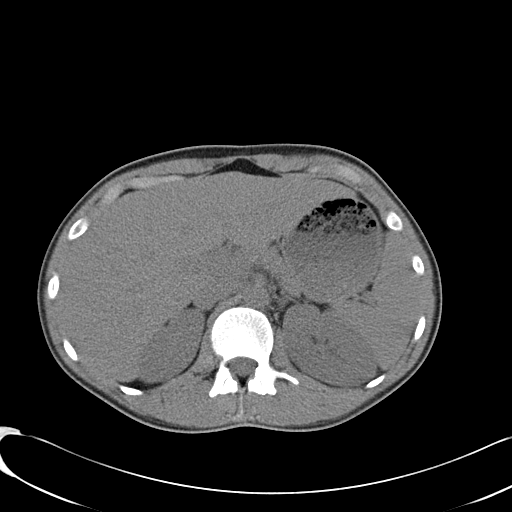
[im 80/84  soft-tissue]
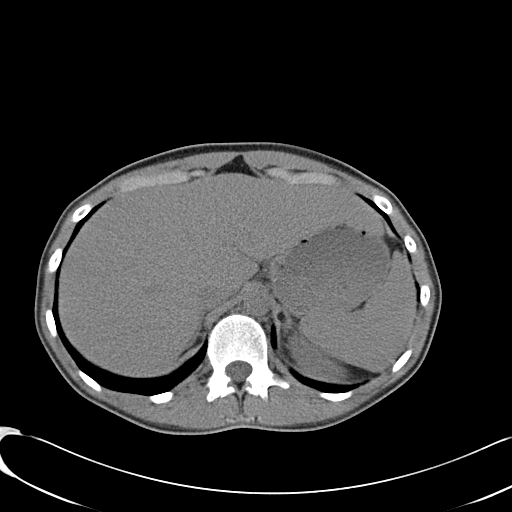

[Series 602: coronal images · coronal · 0.85mm/px · 3 of 69 slices shown]
[im 23/69  soft-tissue]
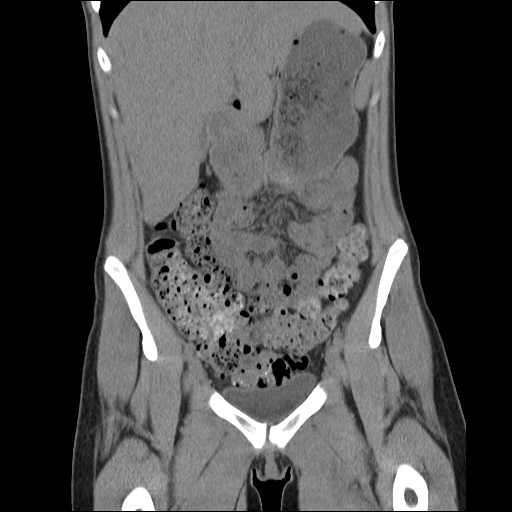
[im 31/69  soft-tissue]
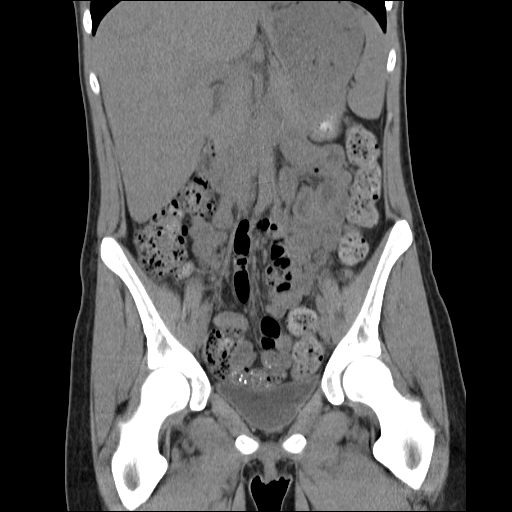
[im 38/69  soft-tissue]
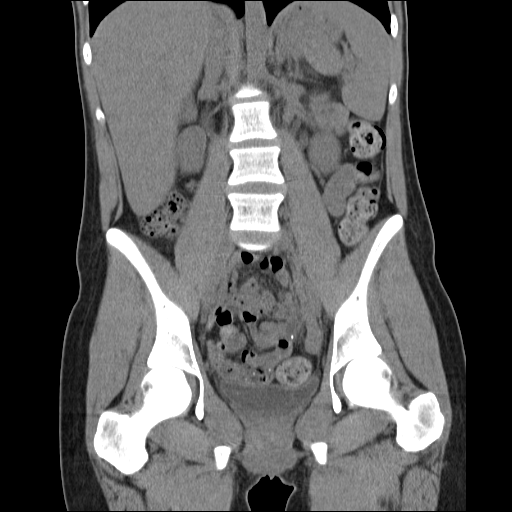

[17 of 46 positions shown; findings below may reference images not displayed]

FINDINGS: The lung bases are clear.  There is no pleural effusion.

Both kidneys appear normal as imaged in the noncontrast state.
There is no evidence of renal or ureteral calculus.  There is no
hydronephrosis or perinephric soft tissue stranding.

The visualized liver, spleen, gallbladder, pancreas and adrenal
glands appear unremarkable as imaged in the noncontrast state.
Moderate stool is present throughout the colon.  There is high-
density particulate matter in the right and sigmoid colon.  The
appendix is best visualized on the coronal images and appears
normal. The uterus, ovaries and urinary bladder appear
unremarkable.
IMPRESSION: 1.  No evidence of urinary tract calculus or hydronephrosis.
2.  No acute inflammatory changes demonstrated.
3.  High density stool throughout the colon suggests a degree of
constipation.  Correlate clinically.

## 2014-01-04 ENCOUNTER — Other Ambulatory Visit: Payer: Self-pay | Admitting: Internal Medicine

## 2014-01-10 MED ORDER — AMPHETAMINE-DEXTROAMPHETAMINE 10 MG PO TABS: ORAL_TABLET | ORAL | Status: DC

## 2014-01-10 MED ORDER — AMPHETAMINE-DEXTROAMPHET ER 20 MG PO CP24: 20.0000 mg | ORAL_CAPSULE | ORAL | Status: DC

## 2014-01-10 NOTE — Telephone Encounter (Signed)
[facility] days since patient has made an appt for 04/03/14 @ 3:15.

## 2014-03-29 ENCOUNTER — Encounter: Payer: Self-pay | Admitting: Internal Medicine

## 2014-03-29 ENCOUNTER — Ambulatory Visit (INDEPENDENT_AMBULATORY_CARE_PROVIDER_SITE_OTHER): Payer: 59 | Admitting: Internal Medicine

## 2014-03-29 ENCOUNTER — Ambulatory Visit: Payer: 59 | Admitting: Internal Medicine

## 2014-03-29 VITALS — BP 138/84 | Temp 98.2°F | Wt 151.6 lb

## 2014-03-29 DIAGNOSIS — IMO0001 Reserved for inherently not codable concepts without codable children: Secondary | ICD-10-CM

## 2014-03-29 DIAGNOSIS — F909 Attention-deficit hyperactivity disorder, unspecified type: Secondary | ICD-10-CM

## 2014-03-29 DIAGNOSIS — F988 Other specified behavioral and emotional disorders with onset usually occurring in childhood and adolescence: Secondary | ICD-10-CM

## 2014-03-29 DIAGNOSIS — R030 Elevated blood-pressure reading, without diagnosis of hypertension: Secondary | ICD-10-CM

## 2014-03-29 DIAGNOSIS — Z79899 Other long term (current) drug therapy: Secondary | ICD-10-CM

## 2014-03-29 MED ORDER — AMPHETAMINE-DEXTROAMPHET ER 20 MG PO CP24: 20.0000 mg | ORAL_CAPSULE | ORAL | Status: DC

## 2014-03-29 MED ORDER — AMPHETAMINE-DEXTROAMPHETAMINE 10 MG PO TABS: ORAL_TABLET | ORAL | Status: DC

## 2014-03-29 NOTE — Progress Notes (Signed)
Pre visit review using our clinic review tool, if applicable. No additional management support is needed unless otherwise documented below in the visit note.    Chief Complaint  Patient presents with  . Med Check    adhd    HPI: Amanda MinksKathryn V Garrison 23 y.o.   Back from [place]  For [place] school  [place] yr [employer]  Class  work  At this time   Doing well with meds  Med  Now taking Every day  In am   Regular     For school days    Up at 6 am    Wears off about 3  If needed then adds on the   1/2 or 10 mg  ocass  Seems to help without sleep interruption and better organizing days . bp up for today bp up with anxiety   Noted at pt school   But after multiple readings came down to  About  115 /86 Exercise    Ok  No se cv  Inhaler  Pre exercise   ocass  Cough  .Marland Kitchen. Deep breath cough  ROS: See pertinent positives and negatives per HPI. denies cp mood issue  Neg td social etoh  Periods ok no se of med  Past Medical History  Diagnosis Date  . Exercise-induced asthma   . GERD (gastroesophageal reflux disease)     does well on ranitidine endo 2010  . Exercise-induced asthma   . Acne   . Attention deficit     [facility]  . Hx of varicella   . History of infectious mononucleosis 10/06  . Pharyngitis 11/09/2011    With acute on history of chronic sinusitis  . Frontal sinusitis 07/21/2012  . Dysmenorrhea 11/29/2007    Qualifier: Diagnosis of  By: Fabian SharpPanosh MD, Neta MendsWanda K     Family History  Problem Relation Age of Onset  . ADD / ADHD Sister     History   Social History  . Marital Status: Single    Spouse Name: N/A    Number of Children: N/A  . Years of Education: N/A   Social History Main Topics  . Smoking status: Never Smoker   . Smokeless tobacco: None  . Alcohol Use: Yes     Comment: occ  . Drug Use: No  . Sexual Activity: Not Currently    Birth Control/ Protection: Pill   Other Topics Concern  . None   Social History Narrative   [employer]  Good grades minor  in psychology major exercise sports physiology   NOn smoker    [employer] school [employer] [address]  [address]  [place] 2015             Outpatient Encounter Prescriptions as of 03/29/2014  Medication Sig  . albuterol (PROAIR HFA) 108 (90 BASE) MCG/ACT inhaler Inhale 2 puffs into the lungs every 6 (six) hours as needed.  Marland Kitchen. amphetamine-dextroamphetamine (ADDERALL XR) 20 MG 24 hr capsule Take 1 capsule (20 mg total) by mouth every morning.  Marland Kitchen. amphetamine-dextroamphetamine (ADDERALL XR) 20 MG 24 hr capsule Take 1 capsule (20 mg total) by mouth every morning.  Marland Kitchen. amphetamine-dextroamphetamine (ADDERALL) 10 MG tablet 1/2 -1 po add on for homework  . loratadine (CLARITIN) 10 MG tablet Take 10 mg by mouth daily as needed for allergies.  Marland Kitchen. norethindrone-ethinyl estradiol (LOESTRIN 1/20, 21,) 1-20 MG-MCG tablet Take 1 tablet by mouth daily.  . ranitidine (ZANTAC) 300 MG tablet Take 1 tablet (300 mg total)  by mouth 2 (two) times daily.  . [DISCONTINUED] amphetamine-dextroamphetamine (ADDERALL XR) 20 MG 24 hr capsule Take 1 capsule (20 mg total) by mouth every morning.  . [DISCONTINUED] amphetamine-dextroamphetamine (ADDERALL) 10 MG tablet 1/2 -1 po add on for homework    EXAM:  BP 138/84 mmHg  Temp(Src) 98.2 F (36.8 C) (Oral)  Wt 151 lb 9.6 oz (68.765 kg)  Body mass index is 22.07 kg/(m^2).  GENERAL: vitals reviewed and listed above, alert, oriented, appears well hydrated and in no acute distress HEENT: atraumatic, conjunctiva  clear, no obvious abnormalities on inspection of external nose and ears OP : no lesion edema or exudate  NECK: no obvious masses on inspection palpation  LUNGS: clear to auscultation bilaterally, no wheezes, rales or rhonchi, good air movement CV: HRRR, no clubbing cyanosis or  peripheral edema nl cap refill  MS: moves all extremities without noticeable focal  abnormality PSYCH: pleasant and cooperative, no obvious depression or anxiety minimally   ASSESSMENT AND PLAN:  Discussed  the following assessment and plan:  Attention deficit disorder - stable at this time  Elevated BP - better on repeat  get more readings use my chart to send in .  Medication management - benefit more than risk of med  . monitor for se 3 month refill rov in 6 month or when on break for convenience   -Patient advised to return or notify health care team  if symptoms worsen ,persist or new concerns arise.  Patient Instructions  Continue lifestyle intervention healthy eating and exercise . Continue med as you are doing.   Take blood pressure readings twice a day for 7- 10 days and then periodically .To ensure below 140/90   .Send in readings by my chart.   ROV in 6 month wellness    Neta MendsWanda K. [name] M.D.

## 2014-03-29 NOTE — Patient Instructions (Addendum)
Continue lifestyle intervention healthy eating and exercise . Continue med as you are doing.   Take blood pressure readings twice a day for 7- 10 days and then periodically .To ensure below 140/90   .Send in readings by my chart.   ROV in 6 month wellness

## 2014-03-30 ENCOUNTER — Ambulatory Visit: Payer: 59 | Admitting: Internal Medicine

## 2014-04-03 ENCOUNTER — Ambulatory Visit: Payer: 59 | Admitting: Internal Medicine

## 2014-06-27 ENCOUNTER — Other Ambulatory Visit: Payer: Self-pay | Admitting: Internal Medicine

## 2014-07-18 ENCOUNTER — Other Ambulatory Visit: Payer: Self-pay | Admitting: Internal Medicine

## 2014-07-19 MED ORDER — AMPHETAMINE-DEXTROAMPHETAMINE 10 MG PO TABS: ORAL_TABLET | ORAL | Status: DC

## 2014-07-19 MED ORDER — AMPHETAMINE-DEXTROAMPHET ER 20 MG PO CP24: 20.0000 mg | ORAL_CAPSULE | ORAL | Status: DC

## 2014-07-26 ENCOUNTER — Encounter: Payer: Self-pay | Admitting: Internal Medicine

## 2014-07-26 ENCOUNTER — Other Ambulatory Visit: Payer: Self-pay | Admitting: Internal Medicine

## 2014-07-26 MED ORDER — NORETHINDRONE ACET-ETHINYL EST 1-20 MG-MCG PO TABS: 1.0000 | ORAL_TABLET | Freq: Every day | ORAL | Status: DC

## 2014-08-24 ENCOUNTER — Encounter: Payer: Self-pay | Admitting: Internal Medicine

## 2014-10-12 ENCOUNTER — Telehealth: Payer: Self-pay | Admitting: Family Medicine

## 2014-10-12 ENCOUNTER — Telehealth: Payer: Self-pay | Admitting: Internal Medicine

## 2014-10-12 ENCOUNTER — Other Ambulatory Visit: Payer: Self-pay | Admitting: Internal Medicine

## 2014-10-12 ENCOUNTER — Other Ambulatory Visit: Payer: Self-pay | Admitting: Family Medicine

## 2014-10-12 DIAGNOSIS — Z0000 Encounter for general adult medical examination without abnormal findings: Secondary | ICD-10-CM

## 2014-10-12 NOTE — Telephone Encounter (Signed)
Pt is due for yearly physical.  Please contact the pt and schedule.  I have placed the lab orders.  Thanks!

## 2014-10-12 NOTE — Telephone Encounter (Signed)
lmom for pt to return my call. 

## 2014-10-12 NOTE — Telephone Encounter (Signed)
Pt request refill of the following: norethindrone-ethinyl estradiol (LOESTRIN 1/20, 21,) 1-20 MG-MCG tablet   Phamacy: [facility] [address]

## 2014-10-12 NOTE — Telephone Encounter (Signed)
This has been completed.

## 2014-10-12 NOTE — Telephone Encounter (Signed)
3 month supply sent to the pharmacy.  Message sent to scheduling to help the pt make cpx appt and lab appt.

## 2014-10-20 NOTE — Telephone Encounter (Signed)
lmom for pt to call back

## 2014-10-24 NOTE — Telephone Encounter (Signed)
lmom for pt to call back

## 2014-12-14 ENCOUNTER — Other Ambulatory Visit: Payer: Self-pay | Admitting: Internal Medicine

## 2014-12-15 ENCOUNTER — Telehealth: Payer: Self-pay

## 2014-12-15 MED ORDER — NORETHINDRONE ACET-ETHINYL EST 1-20 MG-MCG PO TABS: 1.0000 | ORAL_TABLET | Freq: Every day | ORAL | Status: DC

## 2014-12-15 NOTE — Addendum Note (Signed)
Addended by: Raj Janus T on: 12/15/2014 04:58 PM   Modules accepted: Orders

## 2014-12-15 NOTE — Telephone Encounter (Signed)
The pt's mom called and is hoping to get an update on the pt's adderall medicine being refilled.    Mom's callback - [phone]

## 2014-12-20 NOTE — Telephone Encounter (Signed)
Appointment moved to 04/10/15 while on break.  Please advise.

## 2014-12-21 NOTE — Telephone Encounter (Signed)
She was supposed to send in her BP readings  From last visit  Because it was elevated    ) she  is taking  2 meds than can elevate blood pressure .  I had advised Visit  Around June.     To recheck .   We can refill med if bp readings are confirmed  Normal. By patient . Until her visit in December  In future we need to have her    Get appt fu earlier . Or may not be able to  refill meds.

## 2014-12-21 NOTE — Telephone Encounter (Signed)
Left a message on [phone] for a return call.

## 2014-12-22 NOTE — Telephone Encounter (Signed)
Left a message for a return call.

## 2014-12-26 NOTE — Telephone Encounter (Signed)
Spoke to patient's mom.  She will call the pt to get bp results.  Will call back. Also need a refill of ranitidine.  Will check with physician.

## 2015-01-03 NOTE — Telephone Encounter (Signed)
Spoke to the patient's mother.  She is still waiting to hear from Kaity about her bp readings.

## 2015-01-04 ENCOUNTER — Other Ambulatory Visit: Payer: Self-pay

## 2015-01-04 MED ORDER — RANITIDINE HCL 300 MG PO TABS: 300.0000 mg | ORAL_TABLET | Freq: Two times a day (BID) | ORAL | Status: DC

## 2015-01-04 NOTE — Telephone Encounter (Signed)
Ok to refill me until her December visit ,

## 2015-01-04 NOTE — Telephone Encounter (Signed)
Zantac called into the pharmacy

## 2015-01-04 NOTE — Telephone Encounter (Signed)
Three months ago bp was 110/82, Last night 118/68.

## 2015-01-05 MED ORDER — AMPHETAMINE-DEXTROAMPHETAMINE 10 MG PO TABS: ORAL_TABLET | ORAL | Status: DC

## 2015-01-05 MED ORDER — AMPHETAMINE-DEXTROAMPHET ER 20 MG PO CP24: 20.0000 mg | ORAL_CAPSULE | ORAL | Status: DC

## 2015-01-05 NOTE — Telephone Encounter (Signed)
Mom picked up in OV

## 2015-01-05 NOTE — Addendum Note (Signed)
Addended by: Raj Janus T on: 01/05/2015 04:25 PM   Modules accepted: Orders

## 2015-03-09 ENCOUNTER — Encounter: Payer: 59 | Admitting: Internal Medicine

## 2015-04-10 ENCOUNTER — Ambulatory Visit (INDEPENDENT_AMBULATORY_CARE_PROVIDER_SITE_OTHER): Payer: 59 | Admitting: Internal Medicine

## 2015-04-10 ENCOUNTER — Encounter: Payer: Self-pay | Admitting: Internal Medicine

## 2015-04-10 VITALS — BP 130/80 | Temp 97.7°F | Ht 69.5 in | Wt 148.2 lb

## 2015-04-10 DIAGNOSIS — K219 Gastro-esophageal reflux disease without esophagitis: Secondary | ICD-10-CM

## 2015-04-10 DIAGNOSIS — IMO0001 Reserved for inherently not codable concepts without codable children: Secondary | ICD-10-CM

## 2015-04-10 DIAGNOSIS — J45990 Exercise induced bronchospasm: Secondary | ICD-10-CM | POA: Diagnosis not present

## 2015-04-10 DIAGNOSIS — R030 Elevated blood-pressure reading, without diagnosis of hypertension: Secondary | ICD-10-CM

## 2015-04-10 DIAGNOSIS — Z0000 Encounter for general adult medical examination without abnormal findings: Secondary | ICD-10-CM

## 2015-04-10 DIAGNOSIS — F988 Other specified behavioral and emotional disorders with onset usually occurring in childhood and adolescence: Secondary | ICD-10-CM

## 2015-04-10 DIAGNOSIS — F909 Attention-deficit hyperactivity disorder, unspecified type: Secondary | ICD-10-CM

## 2015-04-10 DIAGNOSIS — Z79899 Other long term (current) drug therapy: Secondary | ICD-10-CM

## 2015-04-10 DIAGNOSIS — Z3041 Encounter for surveillance of contraceptive pills: Secondary | ICD-10-CM | POA: Diagnosis not present

## 2015-04-10 MED ORDER — NORETHINDRONE ACET-ETHINYL EST 1-20 MG-MCG PO TABS: 1.0000 | ORAL_TABLET | Freq: Every day | ORAL | Status: DC

## 2015-04-10 MED ORDER — AMPHETAMINE-DEXTROAMPHETAMINE 10 MG PO TABS: ORAL_TABLET | ORAL | Status: DC

## 2015-04-10 MED ORDER — AMPHETAMINE-DEXTROAMPHET ER 20 MG PO CP24: 20.0000 mg | ORAL_CAPSULE | ORAL | Status: DC

## 2015-04-10 NOTE — Progress Notes (Signed)
Pre visit review using our clinic review tool, if applicable. No additional management support is needed unless otherwise documented below in the visit note.  Chief Complaint  Patient presents with  . Annual Exam    HPI: Patient  Amanda Garrison  24 y.o. comes in today for Preventive Health Care visit  And med evaluation  Bp borderline  And then comes down  When in class has Wc effect and can feel bp rising   Lmp:    Minimal at this time  Every 3 months continuous therapy  Also not  painful.   adhd meds    Taking add on med in afternoon on school days study days has 2 more years pt program   GI taking ranitidine  Off and on   At times .  When eats certain foods . Spaghetti sauce . Caffiene. Can she take it intermittently?   EIA ok  Easier in [place]   Kickball at times.  Pre medicating . Albuterol only  Health Maintenance  Topic Date Due  . HIV Screening  10/12/2005  . INFLUENZA VACCINE  11/13/2014  . PAP SMEAR  08/11/2016  . TETANUS/TDAP  07/20/2023   Health Maintenance Review LIFESTYLE:  Exercise:  Yes  Tobacco/ETS:no Alcohol: once a day .  Or less  Sugar beverages:  No sig  Sleep: 8-9  At school  Better  7-8  May 18  Drug use: no Denies sig depression anxiety although does tend to stress   About stress   ROS:  GEN/ HEENT: No fever, significant weight changes sweats headaches vision problems hearing changes, CV/ PULM; No chest pain shortness of breath cough, syncope,edema  change in exercise tolerance. GI /GU: No adominal pain, vomiting, change in bowel habits. No blood in the stool. No significant GU symptoms. SKIN/HEME: ,no acute skin rashes suspicious lesions or bleeding. No lymphadenopathy, nodules, masses.  NEURO/ PSYCH:  No neurologic signs such as weakness numbness. No depression anxiety. IMM/ Allergy: No unusual infections.  Allergy .   REST of 12 system review negative except as per HPI   Past Medical History  Diagnosis Date  . Exercise-induced asthma   . GERD  (gastroesophageal reflux disease)     does well on ranitidine endo 2010  . Exercise-induced asthma   . Acne   . Attention deficit     [facility]  . Hx of varicella   . History of infectious mononucleosis 10/06  . Pharyngitis 11/09/2011    With acute on history of chronic sinusitis  . Frontal sinusitis 07/21/2012  . Dysmenorrhea 11/29/2007    Qualifier: Diagnosis of  By: Fabian SharpPanosh MD, Neta MendsWanda K     Past Surgical History  Procedure Laterality Date  . Myringotomy  97 and 98    Family History  Problem Relation Age of Onset  . ADD / ADHD Sister     Social History   Social History  . Marital Status: Single    Spouse Name: N/A  . Number of Children: N/A  . Years of Education: N/A   Social History Main Topics  . Smoking status: Never Smoker   . Smokeless tobacco: None  . Alcohol Use: Yes     Comment: occ  . Drug Use: No  . Sexual Activity: Not Currently    Birth Control/ Protection: Pill   Other Topics Concern  . 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 albuterol (PROAIR HFA) 108 (90 BASE) MCG/ACT inhaler Inhale 2 puffs into the lungs every 6 (six) hours as needed. 1 Inhaler 3  . loratadine (CLARITIN) 10 MG tablet Take 10 mg by mouth daily as needed for allergies.    . ranitidine (ZANTAC) 300 MG tablet Take 1 tablet (300 mg total) by mouth 2 (two) times daily. 180 tablet 3  . ranitidine (ZANTAC) 300 MG tablet Take 1 tablet (300 mg total) by mouth 2 (two) times daily. 180 tablet 3  . amphetamine-dextroamphetamine (ADDERALL XR) 20 MG 24 hr capsule Take 1 capsule (20 mg total) by mouth every morning. 90 capsule 0  . amphetamine-dextroamphetamine (ADDERALL) 10 MG tablet 1/2 -1 po add on for homework 50 tablet 0  . norethindrone-ethinyl estradiol  (JUNEL 1/20) 1-20 MG-MCG tablet Take 1 tablet by mouth daily. Take Continuously 4 Package 0  . amphetamine-dextroamphetamine (ADDERALL XR) 20 MG 24 hr capsule Take 1 capsule (20 mg total) by mouth every morning. 90 capsule 0   No facility-administered medications prior to visit.     EXAM:  BP 130/80 mmHg  Temp(Src) 97.7 F (36.5 C) (Oral)  Ht 5' 9.5" (1.765 m)  Wt 148 lb 3.2 oz (67.223 kg)  BMI 21.58 kg/m2  Body mass index is 21.58 kg/(m^2).  Physical Exam: Vital signs reviewed WUJ:WJXB is a well-developed well-nourished alert cooperative    who appearsr stated age in no acute distress.  HEENT: normocephalic atraumatic , Eyes: PERRL EOM's full, conjunctiva clear, Nares: paten,t no deformity discharge or tenderness., Ears: no deformity EAC's clear TMs with normal landmarks. Mouth: clear OP, no lesions, edema.  Moist mucous membranes. Dentition in adequate repair. NECK: supple without masses, thyromegaly or bruits. CHEST/PULM:  Clear to auscultation and percussion breath sounds equal no wheeze , rales or rhonchi. No chest wall deformities or tenderness.Breast: normal by inspection . No dimpling, discharge, masses, tenderness or discharge . CV: PMI is nondisplaced, S1 S2 no gallops, murmurs, rubs. Peripheral pulses are full without delay.No JVD .  ABDOMEN: Bowel sounds normal nontender  No guard or rebound, no hepato splenomegal no CVA tenderness.  No hernia. Extremtities:  No clubbing cyanosis or edema, no acute joint swelling or redness no focal atrophy NEURO:  Oriented x3, cranial nerves 3-12 appear to be intact, no obvious focal weakness,gait within normal limits no abnormal reflexes or asymmetrical SKIN: No acute rashes normal turgor, color, no bruising or petechiae. PSYCH: Oriented, good eye contact, no obvious depression anxiety, cognition and judgment appear normal. LN: no cervical axillary inguinal adenopathy  Lab Results  Component Value Date   WBC 7.6 04/09/2012   HGB 13.4  04/09/2012   HCT 39.8 04/09/2012   PLT 328.0 04/09/2012   GLUCOSE 75 04/09/2012   CHOL 171 04/09/2012   TRIG 106.0 04/09/2012   HDL 63.80 04/09/2012   LDLCALC 86 04/09/2012   ALT 15 04/09/2012   AST 19 04/09/2012   NA 137 04/09/2012   K 4.3 04/09/2012   CL 103 04/09/2012   CREATININE 0.8 04/09/2012   BUN 13 04/09/2012   CO2 24 04/09/2012   TSH 0.65 12/10/2012    ASSESSMENT AND PLAN:  Discussed the following assessment and plan:  Visit for preventive health examination  Attention deficit disorder - benefot more than risk of med at this  time  Medication management - due for tox screen low risk talk with parents about insurance and billing  anc can get this week   ASTHMA, EXERCISE INDUCED  Oral contraceptive use - surveillance dsic iud is a good choice and doesnt effect Bp readings   Gastroesophageal reflux disease, esophagitis presence not specified - controlled on h2 blocker and  food choices no alarm features  ok to take weeks at a time and then off if neeee3d  Elevated BP - comes down on repeat  monitor baseline on ocps    Risk benefit of medication discussed. Tox screen routine   Due  Reviewed process can come back in week for this.  Patient Care Team: Madelin Headings, MD as PCP - General Patient Instructions    Make sure bp is in normal range when not in office setting as you are doing ., Iud like mirena is a good option as we discussed .  Can disc with your gyne . Have pharmacy ocntact [place] for refills other meds     [name]. Shogo Larkey M.D.

## 2015-04-10 NOTE — Patient Instructions (Signed)
   Make sure bp is in normal range when not in office setting as you are doing ., Iud like mirena is a good option as we discussed .  Can disc with your gyne . Have pharmacy ocntact us for refills other meds

## 2015-04-12 ENCOUNTER — Encounter: Payer: Self-pay | Admitting: Internal Medicine

## 2015-04-19 ENCOUNTER — Other Ambulatory Visit: Payer: Self-pay | Admitting: Internal Medicine

## 2015-05-02 ENCOUNTER — Encounter: Payer: Self-pay | Admitting: Internal Medicine

## 2015-07-03 ENCOUNTER — Telehealth: Payer: Self-pay | Admitting: Internal Medicine

## 2015-07-03 NOTE — Telephone Encounter (Signed)
Ok to refill   X 1 fpr addera;; 10 disp 50 and adderallxr  20 mg disp 90  Ok x 6 for the junel

## 2015-07-04 MED ORDER — AMPHETAMINE-DEXTROAMPHET ER 20 MG PO CP24: 20.0000 mg | ORAL_CAPSULE | ORAL | Status: DC

## 2015-07-04 MED ORDER — AMPHETAMINE-DEXTROAMPHETAMINE 10 MG PO TABS: ORAL_TABLET | ORAL | Status: DC

## 2015-07-04 MED ORDER — NORETHINDRONE ACET-ETHINYL EST 1-20 MG-MCG PO TABS: 1.0000 | ORAL_TABLET | Freq: Every day | ORAL | Status: DC

## 2015-07-04 MED FILL — LARIN 21 1-20 TABLET: 1-20 | 84 days supply | Qty: 84 | Fill #0

## 2015-07-04 NOTE — Telephone Encounter (Signed)
See MyChart prescription request.

## 2015-07-06 MED FILL — AMPHETAMINE SALTS 10 MG TAB: 10 | 50 days supply | Qty: 50 | Fill #0

## 2015-07-09 MED FILL — DEXTROAMP-AMPHET ER 20 MG C: 20 | 90 days supply | Qty: 90 | Fill #0

## 2015-08-15 ENCOUNTER — Ambulatory Visit: Payer: 59 | Admitting: Internal Medicine

## 2015-08-17 ENCOUNTER — Ambulatory Visit (INDEPENDENT_AMBULATORY_CARE_PROVIDER_SITE_OTHER): Payer: 59 | Admitting: Gynecology

## 2015-08-17 ENCOUNTER — Encounter: Payer: Self-pay | Admitting: Gynecology

## 2015-08-17 VITALS — BP 130/82 | Ht 71.0 in | Wt 149.6 lb

## 2015-08-17 DIAGNOSIS — Z01419 Encounter for gynecological examination (general) (routine) without abnormal findings: Secondary | ICD-10-CM

## 2015-08-17 DIAGNOSIS — Z113 Encounter for screening for infections with a predominantly sexual mode of transmission: Secondary | ICD-10-CM | POA: Diagnosis not present

## 2015-08-17 NOTE — Progress Notes (Signed)
Amanda Garrison 11-13-1990 [id number]   History:    25 y.o.  for annual gyn exam with no complaints but interested in changing her contraceptive method from oral contraceptive pill to the IUD. She was here to discuss different types of IUD. Patient states that her internist as told her blood pressures has been fluctuating it may be a good idea to come off the oral contraceptive pill. She is otherwise been having normal menstrual cycle. She has completed the HPV vaccine series in the past. Her Pap smear 2015 was normal.  Past medical history,surgical history, family history and social history were all reviewed and documented in the EPIC chart.  Gynecologic History Patient's last menstrual period was 05/17/2015. Contraception: OCP (estrogen/progesterone) Last Pap: 2015. Results were: normal Last mammogram: Not indicated. Results were: Not indicated  Obstetric History OB History  No data available     ROS: A ROS was performed and pertinent positives and negatives are included in the history.  GENERAL: No fevers or chills. HEENT: No change in vision, no earache, sore throat or sinus congestion. NECK: No pain or stiffness. CARDIOVASCULAR: No chest pain or pressure. No palpitations. PULMONARY: No shortness of breath, cough or wheeze. GASTROINTESTINAL: No abdominal pain, nausea, vomiting or diarrhea, melena or bright red blood per rectum. GENITOURINARY: No urinary frequency, urgency, hesitancy or dysuria. MUSCULOSKELETAL: No joint or muscle pain, no back pain, no recent trauma. DERMATOLOGIC: No rash, no itching, no lesions. ENDOCRINE: No polyuria, polydipsia, no heat or cold intolerance. No recent change in weight. HEMATOLOGICAL: No anemia or easy bruising or bleeding. NEUROLOGIC: No headache, seizures, numbness, tingling or weakness. PSYCHIATRIC: No depression, no loss of interest in normal activity or change in sleep pattern.     Exam: chaperone present  BP 130/82 mmHg  Ht  (1.803 m)   Wt 149 lb 9.6 oz (67.858 kg)  BMI 20.87 kg/m2  LMP 05/17/2015  Body mass index is 20.87 kg/(m^2).  General appearance : Well developed well nourished female. No acute distress HEENT: Eyes: no retinal hemorrhage or exudates,  Neck supple, trachea midline, no carotid bruits, no thyroidmegaly Lungs: Clear to auscultation, no rhonchi or wheezes, or rib retractions  Heart: Regular rate and rhythm, no murmurs or gallops Breast:Examined in sitting and supine position were symmetrical in appearance, no palpable masses or tenderness,  no skin retraction, no nipple inversion, no nipple discharge, no skin discoloration, no axillary or supraclavicular lymphadenopathy Abdomen: no palpable masses or tenderness, no rebound or guarding Extremities: no edema or skin discoloration or tenderness  Pelvic:  Bartholin, Urethra, Skene Glands: Within normal limits             Vagina: No gross lesions or discharge  Cervix: No gross lesions or discharge  Uterus  anteverted, normal size, shape and consistency, non-tender and mobile  Adnexa  Without masses or tenderness  Anus and perineum  normal   Rectovaginal  normal sphincter tone without palpated masses or tenderness             Hemoccult not indicated     Assessment/Plan:  25 y.o. female for annual exam has had her blood work drawn by her PCP. GC and Chlamydia culture was done today. Pap smear not indicated this year. Patient was provided with literature information on the [place] and Mirena IUD. Patient states that when she has not been on the oral contraceptive pills that she has had heavy painful menses so one of these 2 would be better for cycle control  as well as for contraception. Patient to schedule with her upcoming menstrual cycle.   Ok EdwardsFERNANDEZ,Esvin Hnat H MD, 2:21 PM 08/17/2015

## 2015-08-18 LAB — GC/CHLAMYDIA PROBE AMP
CT Probe RNA: NOT DETECTED
GC PROBE AMP APTIMA: NOT DETECTED

## 2015-08-19 ENCOUNTER — Encounter: Payer: Self-pay | Admitting: Gynecology

## 2015-08-21 ENCOUNTER — Telehealth: Payer: Self-pay | Admitting: Gynecology

## 2015-08-21 ENCOUNTER — Telehealth: Payer: Self-pay | Admitting: *Deleted

## 2015-08-21 DIAGNOSIS — H52203 Unspecified astigmatism, bilateral: Secondary | ICD-10-CM | POA: Diagnosis not present

## 2015-08-21 DIAGNOSIS — H5213 Myopia, bilateral: Secondary | ICD-10-CM | POA: Diagnosis not present

## 2015-08-21 NOTE — Telephone Encounter (Signed)
08/21/15-Pt Mom(ok per DPR) was given [facility] which are that [facility] will cover it for contraception at 100%, no copay.wl- Per [id number]#[id number].wl

## 2015-08-21 NOTE — Telephone Encounter (Signed)
Amanda Garrison gave the okay to proceed with ordering Amanda Garrison for Amanda Garrison. Amanda Garrison is on Amanda Garrison cycle now, Amanda Garrison will check benefits and Amanda Garrison and Amanda Garrison.

## 2015-08-23 ENCOUNTER — Ambulatory Visit (INDEPENDENT_AMBULATORY_CARE_PROVIDER_SITE_OTHER): Payer: 59 | Admitting: Gynecology

## 2015-08-23 ENCOUNTER — Encounter: Payer: Self-pay | Admitting: Gynecology

## 2015-08-23 VITALS — BP 118/70

## 2015-08-23 DIAGNOSIS — Z30430 Encounter for insertion of intrauterine contraceptive device: Secondary | ICD-10-CM

## 2015-08-23 NOTE — Progress Notes (Addendum)
   Patient is a 25 year old who presented to the office on May 11 to place a [place] IUD for contraception. Prior to that she was seen in the office on May 5 for her annual exam and she had voiced that she wanted to come off the oral contraceptive pill because her internist had told that her blood pressure was borderline. She was reporting normal menstrual cycles and during that visit she had been provided with literature information on the IUD. On this to office visit date she was on her menses and procedures were described as follows:  Abdomen: Soft nontender no rebound or guarding Pelvic: Bartholin urethra Skene was within normal limits Vagina: Menstrual blood present Uterus: Anteverted normal size shape and consistency Adnexa: No palpable mass or tenderness Rectal exam: Not done  The cervix was cleansed with Betadine solution. The CO2 tenaculum was placed on the anterior cervical lip. The uterus sounded to 6-1/2 cm. The Skyla inserting device was placed according to the measurements of 6-1/2 cm uterine depth and inserted into the uterine cavity. The IUD was released and upon cutting the string it became partly attached and the IUD was inadvertently pulled out.  Assessment/plan: Patient will return back with next menstrual cycle to reinsert a new Skyla IUD due to the mechanical issue described above after cutting off the IUD string. This was related to the patient and we'll schedule [place].

## 2015-09-03 ENCOUNTER — Ambulatory Visit (INDEPENDENT_AMBULATORY_CARE_PROVIDER_SITE_OTHER): Payer: 59 | Admitting: Gynecology

## 2015-09-03 ENCOUNTER — Encounter: Payer: Self-pay | Admitting: Gynecology

## 2015-09-03 VITALS — BP 154/94

## 2015-09-03 DIAGNOSIS — J019 Acute sinusitis, unspecified: Secondary | ICD-10-CM

## 2015-09-03 DIAGNOSIS — Z30430 Encounter for insertion of intrauterine contraceptive device: Secondary | ICD-10-CM

## 2015-09-03 DIAGNOSIS — Z975 Presence of (intrauterine) contraceptive device: Secondary | ICD-10-CM | POA: Insufficient documentation

## 2015-09-03 MED ORDER — CEFUROXIME AXETIL 250 MG PO TABS: 250.0000 mg | ORAL_TABLET | Freq: Two times a day (BID) | ORAL | Status: DC

## 2015-09-03 MED ORDER — FLUCONAZOLE 150 MG PO TABS: 150.0000 mg | ORAL_TABLET | Freq: Once | ORAL | Status: DC

## 2015-09-03 MED FILL — CEFUROXIME AXETIL 250 MG TA: 250 | 7 days supply | Qty: 14 | Fill #0

## 2015-09-03 MED FILL — FLUCONAZOLE 150 MG TABLET: 150 | 1 days supply | Qty: 1 | Fill #0

## 2015-09-03 NOTE — Patient Instructions (Signed)
Cefuroxime tablets What is this medicine? CEFUROXIME (se fyoor OX eem) is a cephalosporin antibiotic. It is used to treat certain kinds of bacterial infections. It will not work for colds, flu, or other viral infections. This medicine may be used for other purposes; ask your health care provider or pharmacist if you have questions. What should I tell my health care provider before I take this medicine? They need to know if you have any of these conditions: -bleeding problems -bowel disease, like colitis -kidney disease -liver disease -an unusual or allergic reaction to cefuroxime, other antibiotics or medicines, foods, dyes or preservatives -pregnant or trying to get pregnant -breast-feeding How should I use this medicine? Take this medicine by mouth with a full glass of water. Follow the directions on the prescription label. Do not crush or chew. This medicine works best if you take it with food. Take your medicine at regular intervals. Do not take your medicine more often than directed. Take all of your medicine as directed even if you think your are better. Do not skip doses or stop your medicine early. Talk to your pediatrician regarding the use of this medicine in children. Special care may be needed. While this drug may be prescribed for children as young as 37 months of age for selected conditions, precautions do apply. Overdosage: If you think you have taken too much of this medicine contact a poison control center or emergency room at once. NOTE: This medicine is only for you. Do not share this medicine with others. What if I miss a dose? If you miss a dose, take it as soon as you can. If it is almost time for your next dose, take only that dose. Do not take double or extra doses. What may interact with this medicine? This medicine may interact with the following medications: -antacids -birth control pills -certain medicines for infection like amikacin, gentamicin,  tobramycin -diuretics -probenecid -warfarin This list may not describe all possible interactions. Give your health care provider a list of all the medicines, herbs, non-prescription drugs, or dietary supplements you use. Also tell them if you smoke, drink alcohol, or use illegal drugs. Some items may interact with your medicine. What should I watch for while using this medicine? Tell your doctor or health care professional if your symptoms do not improve or if you get new symptoms. Do not treat diarrhea with over the counter products. Contact your doctor if you have diarrhea that lasts more than 2 days or if it is severe and watery. This medicine can interfere with some urine glucose tests. If you use such tests, talk with your health care professional. If you are being treated for a sexually transmitted disease, avoid sexual contact until you have finished your treatment. Your sexual partner may also need treatment. What side effects may I notice from receiving this medicine? Side effects that you should report to your doctor or health care professional as soon as possible: -allergic reactions like skin rash, itching or hives, swelling of the face, lips, or tongue -dark urine -difficulty breathing -fever -irregular heartbeat or chest pain -redness, blistering, peeling or loosening of the skin, including inside the mouth -seizures -unusual bleeding or bruising -unusually weak or tired -white patches or sores in the mouth Side effects that usually do not require medical attention (report to your doctor or health care professional if they continue or are bothersome): -diarrhea -gas or heartburn -headache -nausea, vomiting -vaginal itching This list may not describe all possible side effects.  Call your doctor for medical advice about side effects. You may report side effects to FDA at 1-800-FDA-1088. Where should I keep my medicine? Keep out of the reach of children. Store at room  temperature between 15 and 30 degrees C (59 and 86 degrees F). Keep container tightly closed. Protect from moisture. Throw away any unused medicine after the expiration date. NOTE: This sheet is a summary. It may not cover all possible information. If you have questions about this medicine, talk to your doctor, pharmacist, or health care provider.    2016, Elsevier/Gold Standard. (2013-02-14 09:43:18) Sinusitis, Adult Sinusitis is redness, soreness, and inflammation of the paranasal sinuses. Paranasal sinuses are air pockets within the bones of your face. They are located beneath your eyes, in the middle of your forehead, and above your eyes. In healthy paranasal sinuses, mucus is able to drain out, and air is able to circulate through them by way of your nose. However, when your paranasal sinuses are inflamed, mucus and air can become trapped. This can allow bacteria and other germs to grow and cause infection. Sinusitis can develop quickly and last only a short time (acute) or continue over a long period (chronic). Sinusitis that lasts for more than 12 weeks is considered chronic. CAUSES Causes of sinusitis include:  Allergies.  Structural abnormalities, such as displacement of the cartilage that separates your nostrils (deviated septum), which can decrease the air flow through your nose and sinuses and affect sinus drainage.  Functional abnormalities, such as when the small hairs (cilia) that line your sinuses and help remove mucus do not work properly or are not present. SIGNS AND SYMPTOMS Symptoms of acute and chronic sinusitis are the same. The primary symptoms are pain and pressure around the affected sinuses. Other symptoms include:  Upper toothache.  Earache.  Headache.  Bad breath.  Decreased sense of smell and taste.  A cough, which worsens when you are lying flat.  Fatigue.  Fever.  Thick drainage from your nose, which often is green and may contain pus  (purulent).  Swelling and warmth over the affected sinuses. DIAGNOSIS Your health care provider will perform a physical exam. During your exam, your health care provider may perform any of the following to help determine if you have acute sinusitis or chronic sinusitis:  Look in your nose for signs of abnormal growths in your nostrils (nasal polyps).  Tap over the affected sinus to check for signs of infection.  View the inside of your sinuses using an imaging device that has a light attached (endoscope). If your health care provider suspects that you have chronic sinusitis, one or more of the following tests may be recommended:  Allergy tests.  Nasal culture. A sample of mucus is taken from your nose, sent to a lab, and screened for bacteria.  Nasal cytology. A sample of mucus is taken from your nose and examined by your health care provider to determine if your sinusitis is related to an allergy. TREATMENT Most cases of acute sinusitis are related to a viral infection and will resolve on their own within 10 days. Sometimes, medicines are prescribed to help relieve symptoms of both acute and chronic sinusitis. These may include pain medicines, decongestants, nasal steroid sprays, or saline sprays. However, for sinusitis related to a bacterial infection, your health care provider will prescribe antibiotic medicines. These are medicines that will help kill the bacteria causing the infection. Rarely, sinusitis is caused by a fungal infection. In these cases, your health care  provider will prescribe antifungal medicine. For some cases of chronic sinusitis, surgery is needed. Generally, these are cases in which sinusitis recurs more than 3 times per year, despite other treatments. HOME CARE INSTRUCTIONS  Drink plenty of water. Water helps thin the mucus so your sinuses can drain more easily.  Use a humidifier.  Inhale steam 3-4 times a day (for example, sit in the bathroom with the shower  running).  Apply a warm, moist washcloth to your face 3-4 times a day, or as directed by your health care provider.  Use saline nasal sprays to help moisten and clean your sinuses.  Take medicines only as directed by your health care provider.  If you were prescribed either an antibiotic or antifungal medicine, finish it all even if you start to feel better. SEEK IMMEDIATE MEDICAL CARE IF:  You have increasing pain or severe headaches.  You have nausea, vomiting, or drowsiness.  You have swelling around your face.  You have vision problems.  You have a stiff neck.  You have difficulty breathing.   This information is not intended to replace advice given to you by your health care provider. Make sure you discuss any questions you have with your health care provider.   Document Released: 03/31/2005 Document Revised: 04/21/2014 Document Reviewed: 04/15/2011 Elsevier Interactive Patient Education [employer]

## 2015-09-03 NOTE — Progress Notes (Signed)
   Patient is a 25 year old who presented today for placement of the Skyla IUD. She was seen in the office on May 11 no we had 10 neck with difficulty with the IUD and inadvertently it was retrieved during cutting of the string. She is on the tail end of her menses. She was complain is of sinus congestion and some postnasal drip and some hoarseness. Patient had previous and received literature information on the IUD.                                                                      IUD procedure note       Patient presented to the office today for placement of Skyla IUD. The patient had previously been provided with literature information on this method of contraception. The risks benefits and pros and cons were discussed and all her questions were answered. She is fully aware that this form of contraception is 99% effective and is good for 3 years.   HEENT: Tender frontal and maxillary sinuses. No oropharyngeal lesions, no submandibular lymphadenopathy.  Pelvic exam: Bartholin urethra Skene glands: Within normal limits Vagina: No lesions or discharge Cervix: No lesions or discharge Uterus: retroverted position Adnexa: No masses or tenderness Rectal exam: Not done  The cervix was cleansed with Betadine solution. A single-tooth tenaculum was placed on the anterior cervical lip. The uterus sounded to 7 centimeter. The IUD was shown to the patient and inserted in a sterile fashion. The IUD string was trimmed. The single-tooth tenaculum was removed. Patient was instructed to return back to the office in one month for follow up.       For her suspected sinusitis infection I am going to  Start her on  on Ceftin 250 mg twice a day for 7 days. A prescription for Diflucan 150 mg one by mouth was provided the event of yeast infection.

## 2015-09-05 ENCOUNTER — Encounter: Payer: Self-pay | Admitting: Gynecology

## 2015-10-02 ENCOUNTER — Other Ambulatory Visit: Payer: Self-pay | Admitting: Internal Medicine

## 2015-10-03 MED ORDER — AMPHETAMINE-DEXTROAMPHETAMINE 10 MG PO TABS: ORAL_TABLET | ORAL | Status: DC

## 2015-10-03 MED ORDER — AMPHETAMINE-DEXTROAMPHET ER 20 MG PO CP24: 20.0000 mg | ORAL_CAPSULE | ORAL | Status: DC

## 2015-10-10 MED FILL — DEXTROAMP-AMPHET ER 20 MG C: 20 | 90 days supply | Qty: 90 | Fill #0

## 2015-10-10 MED FILL — DEXTROAMP-AMP 10 MG TAB: 10 | 50 days supply | Qty: 50 | Fill #0

## 2015-11-01 ENCOUNTER — Ambulatory Visit: Payer: 59 | Admitting: Gynecology

## 2015-11-27 ENCOUNTER — Ambulatory Visit (INDEPENDENT_AMBULATORY_CARE_PROVIDER_SITE_OTHER): Payer: 59 | Admitting: Gynecology

## 2015-11-27 ENCOUNTER — Encounter: Payer: Self-pay | Admitting: Gynecology

## 2015-11-27 VITALS — BP 122/80 | Ht 71.0 in | Wt 149.0 lb

## 2015-11-27 DIAGNOSIS — Z30431 Encounter for routine checking of intrauterine contraceptive device: Secondary | ICD-10-CM

## 2015-11-27 NOTE — Progress Notes (Signed)
   Patient is a 25 year old who presented to the office for her follow-up after having [facility] in May. Patient has been unable to come to the office for follow-up exam after disinsertion due to the fact that she was weighing college. She's having no breakthrough bleeding still having menstrual cycles once a month otherwise has done well.  Exam: Abdomen: Soft nontender no rebound or guarding Pelvic: Bartholin urethra Skene was within normal limits Vagina: No lesions or discharge Cervix: IUD string visualized Uterus: Anteverted normal size shape and consistency Adnexa: No palpable mass or tenderness Rectal exam: Not done  Assessment/plan: Patient status post placement of Skyla IUD in May of this year doing well. Patient otherwise scheduled to return to the office next year for annual exam. Patient fully where this form of contraception is good for 3 years.  Greater than 50% time was spent counseling and coordinate care for this patient total time of consultation 10 minutes

## 2015-11-28 ENCOUNTER — Ambulatory Visit (INDEPENDENT_AMBULATORY_CARE_PROVIDER_SITE_OTHER): Payer: 59 | Admitting: Internal Medicine

## 2015-11-28 ENCOUNTER — Encounter: Payer: Self-pay | Admitting: Internal Medicine

## 2015-11-28 VITALS — BP 122/78 | Temp 97.6°F | Wt 149.0 lb

## 2015-11-28 DIAGNOSIS — K219 Gastro-esophageal reflux disease without esophagitis: Secondary | ICD-10-CM | POA: Diagnosis not present

## 2015-11-28 DIAGNOSIS — R030 Elevated blood-pressure reading, without diagnosis of hypertension: Secondary | ICD-10-CM

## 2015-11-28 DIAGNOSIS — Z79899 Other long term (current) drug therapy: Secondary | ICD-10-CM | POA: Diagnosis not present

## 2015-11-28 DIAGNOSIS — F909 Attention-deficit hyperactivity disorder, unspecified type: Secondary | ICD-10-CM

## 2015-11-28 DIAGNOSIS — J45990 Exercise induced bronchospasm: Secondary | ICD-10-CM

## 2015-11-28 DIAGNOSIS — F988 Other specified behavioral and emotional disorders with onset usually occurring in childhood and adolescence: Secondary | ICD-10-CM

## 2015-11-28 DIAGNOSIS — IMO0001 Reserved for inherently not codable concepts without codable children: Secondary | ICD-10-CM

## 2015-11-28 MED ORDER — AMPHETAMINE-DEXTROAMPHET ER 20 MG PO CP24: 20.0000 mg | ORAL_CAPSULE | ORAL | 0 refills | Status: DC

## 2015-11-28 MED ORDER — AMPHETAMINE-DEXTROAMPHETAMINE 10 MG PO TABS: ORAL_TABLET | ORAL | 0 refills | Status: DC

## 2015-11-28 NOTE — Progress Notes (Signed)
Pre visit review using our clinic review tool, if applicable. No additional management support is needed unless otherwise documented below in the visit note.'  Chief Complaint  Patient presents with  . Follow-up    HPI: Amanda MinksKathryn V Garrison 25 y.o.  Comes in for follow up of  adhd add:  Medication management . 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  No ranitidine  For 6 weeks   Some times gets flare   For 1-2 weeks .  And off goes off .  Food triggeres   Some stress  And  Ate.    Inhaler with exercise .   Otherwise ok .   3-4 days arobic and yoga .   Got Amanda Garrison iud  Still has bleeding periods  and now bp seems to be  Normal   ROS: See pertinent positives and negatives per HPI.  Past Medical History:  Diagnosis Date  . Acne   . Attention deficit    [facility]  psychology in past  . Dysmenorrhea 11/29/2007   Qualifier: Diagnosis of  By: Fabian SharpPanosh MD, Neta MendsWanda K   . Exercise-induced asthma   . Exercise-induced asthma   . Frontal sinusitis 07/21/2012  . GERD (gastroesophageal reflux disease)    does well on ranitidine endo 2010  . History of infectious mononucleosis 10/06  . Hx of varicella   . Pharyngitis 11/09/2011   With acute on history of chronic sinusitis    Family History  Problem Relation Age of Onset  . ADD / ADHD Sister     Social History   Social History  . Marital status: Single    Spouse name: N/A  . Number of children: N/A  . Years of education: N/A   Social History Main Topics  . Smoking status: Never Smoker  . Smokeless tobacco: Never Used  . Alcohol use Yes     Comment: occ    . Drug use: No  . Sexual activity: Not Currently    Birth control/ protection: Pill   Other Topics Concern  . None   Social History Narrative   [employer]  Good grades minor in psychology major exercise sports physiology   NOn smoker    PT school [employer] [address]  [address]  [place] 2015             Outpatient Medications Prior to Visit  Medication Sig Dispense Refill  . loratadine (CLARITIN) 10 MG tablet Take 10 mg by mouth daily as needed for allergies.    . ranitidine (ZANTAC) 300 MG tablet Take 1 tablet (300 mg total) by mouth 2 (two) times daily. 180 tablet 3  . VENTOLIN HFA 108 (90 Base) MCG/ACT inhaler INHALE 2 PUFFS BY MOUTH EVERY 6 HOURS AS NEEDED 18 g 3  . amphetamine-dextroamphetamine (ADDERALL XR) 20 MG 24 hr capsule Take 1 capsule (20 mg total) by mouth every morning. 90 capsule 0  . amphetamine-dextroamphetamine (ADDERALL) 10 MG tablet 1/2 -1 po add on for homework 50 tablet 0   No facility-administered medications prior to visit.      EXAM:  BP 122/78 (BP Location: Right Arm, Patient Position: Sitting, Cuff Size: Normal)   Temp 97.6 F (36.4 C) (Oral)   Wt 149 lb (67.6 kg)   LMP 10/29/2015   BMI 20.78 kg/m   Body mass index is 20.78 kg/m.  GENERAL: vitals reviewed and listed above, alert, oriented, appears well hydrated and in no acute distress HEENT: atraumatic, conjunctiva  clear, no obvious abnormalities on inspection of external nose and ears OP : no lesion edema or exudate  NECK: no obvious masses on inspection palpation  LUNGS: clear to auscultation bilaterally, no wheezes, rales or rhonchi, good air movement CV: HRRR, no clubbing cyanosis or  peripheral edema nl cap refill  Abdomen:  Sof,t normal bowel sounds without hepatosplenomegaly, no guarding rebound or masses no CVA tenderness MS: moves all extremities without noticeable focal  abnormality PSYCH: pleasant and cooperative, no obvious depression or anxiety BP Readings from Last 3 Encounters:   11/28/15 122/78  11/27/15 122/80  09/03/15 (!) 154/94    ASSESSMENT AND PLAN:  Discussed the following assessment and plan:  Attention deficit disorder - doing well can add on 2.5 - 5 earlier in clinical if needed to avoid sleep se .due tox screen dec 17   Medication management  Elevated BP resolved  - off ocps  on iud nl now  Gastroesophageal reflux disease, esophagitis presence not specified  ASTHMA, EXERCISE INDUCED Adequate response to med . Side effect ;decrease appetite ,acceptable .Continue at same dose . ROV in 4-6 months for med check.  Due tox screen   In December  Come on break when convenient   Gi ok to take periodically . eia stable  -Patient advised to return or notify health care team  if symptoms worsen ,persist or new concerns arise.  Patient Instructions  Continue lifestyle intervention healthy eating and exercise .  Benefit more than risk of medications  to continue.  OV or cpx preventive visit in 4-6 months when convenient we can work in .    Neta MendsWanda K. Edris Schneck M.D.

## 2015-11-28 NOTE — Patient Instructions (Signed)
Continue lifestyle intervention healthy eating and exercise .  Benefit more than risk of medications  to continue.  OV or cpx preventive visit in 4-6 months when convenient we can work in .

## 2015-12-03 MED FILL — DEXTROAMP-AMP 10 MG TAB: 10 | 90 days supply | Qty: 50 | Fill #0

## 2016-01-11 MED FILL — DEXTROAMP-AMPHET ER 20 MG C: 20 | 90 days supply | Qty: 90 | Fill #0

## 2016-04-01 ENCOUNTER — Other Ambulatory Visit: Payer: Self-pay | Admitting: Internal Medicine

## 2016-04-03 ENCOUNTER — Other Ambulatory Visit: Payer: Self-pay | Admitting: Emergency Medicine

## 2016-04-03 MED ORDER — AMPHETAMINE-DEXTROAMPHETAMINE 10 MG PO TABS: ORAL_TABLET | ORAL | 0 refills | Status: DC

## 2016-04-03 MED ORDER — AMPHETAMINE-DEXTROAMPHET ER 20 MG PO CP24: 20.0000 mg | ORAL_CAPSULE | ORAL | 0 refills | Status: DC

## 2016-04-03 MED ORDER — RANITIDINE HCL 300 MG PO TABS: 300.0000 mg | ORAL_TABLET | Freq: Two times a day (BID) | ORAL | 3 refills | Status: AC

## 2016-04-03 MED FILL — raNITIdine HCL 300 MG TABS: 300 | 90 days supply | Qty: 180 | Fill #0

## 2016-04-03 NOTE — Telephone Encounter (Signed)
Script is ready for pick up here at front office and I left a message for pt.  

## 2016-04-03 NOTE — Telephone Encounter (Signed)
done

## 2016-04-03 NOTE — Telephone Encounter (Signed)
Pt mom is aware md will be in office tomorrow

## 2016-04-04 ENCOUNTER — Other Ambulatory Visit: Payer: Self-pay | Admitting: Internal Medicine

## 2016-04-04 MED ORDER — AMPHETAMINE-DEXTROAMPHET ER 20 MG PO CP24: 20.0000 mg | ORAL_CAPSULE | ORAL | 0 refills | Status: DC

## 2016-04-04 MED ORDER — AMPHETAMINE-DEXTROAMPHETAMINE 10 MG PO TABS
ORAL_TABLET | ORAL | 0 refills | Status: DC
Start: 2016-04-04 — End: 2016-06-29

## 2016-04-04 MED FILL — DEXTROAMP-AMPHET ER 20 MG C: 20 | 90 days supply | Qty: 90 | Fill #0

## 2016-04-04 MED FILL — DEXTROAMP-AMP 10 MG TAB: 10 | 90 days supply | Qty: 50 | Fill #0

## 2016-04-04 NOTE — Telephone Encounter (Signed)
Mom walked in and said need 90 days meds ( 90 xr and 50  Ir)  Dr fry had rx 30 days worth   Prev rx shredded  And rewritten  Due for OV feb 18  Or before runs out  Of current med .

## 2016-06-29 ENCOUNTER — Other Ambulatory Visit: Payer: Self-pay | Admitting: Internal Medicine

## 2016-07-02 NOTE — Telephone Encounter (Signed)
She is overdue for her 6 months  Ov appt   uds   She needs to get on schedule   And get uds    After this is done   Then can refill x 1 of each    No further refills without OV.  Ok to do PA as she [employer]

## 2016-07-03 NOTE — Telephone Encounter (Signed)
Left a voicemail for pt to give the office a call back 

## 2016-07-04 ENCOUNTER — Telehealth: Payer: Self-pay | Admitting: Emergency Medicine

## 2016-07-04 MED ORDER — AMPHETAMINE-DEXTROAMPHETAMINE 10 MG PO TABS: ORAL_TABLET | ORAL | 0 refills | Status: DC

## 2016-07-04 MED ORDER — AMPHETAMINE-DEXTROAMPHET ER 20 MG PO CP24: 20.0000 mg | ORAL_CAPSULE | ORAL | 0 refills | Status: DC

## 2016-07-04 NOTE — Telephone Encounter (Signed)
Left a voicemail for pt in regards to script being ready for pickup

## 2016-07-04 NOTE — Telephone Encounter (Signed)
Pt will not be back in [place] until may. [place] school [place]  Pt made appt on 08/20/2016 for OV. Hopes to get the refill now.

## 2016-07-14 MED FILL — AMPHETAMINE SALTS 10 MG TAB: 10 | 50 days supply | Qty: 50 | Fill #0

## 2016-07-14 MED FILL — ADDERALL XR 20 MG CAP SA: 20 | 90 days supply | Qty: 90 | Fill #0

## 2016-08-12 DIAGNOSIS — R87612 Low grade squamous intraepithelial lesion on cytologic smear of cervix (LGSIL): Secondary | ICD-10-CM

## 2016-08-12 HISTORY — DX: Low grade squamous intraepithelial lesion on cytologic smear of cervix (LGSIL): R87.612

## 2016-08-18 NOTE — Progress Notes (Signed)
Chief Complaint  Patient presents with  . Follow-up    medication    HPI: Amanda Garrison 26 y.o.Comes in for follow up of  adhd add:  Medication management . Since last visit : Taking medication: daily feels more normal on this  ocass pm IR doses  School/ grades : graduating next week  Pt   Has jov in [employer]   Sleep : yes ok takes IR some times 2  Pm if needed not dailu  Diet : attended  Mood : good  No tad of sig  Medication assessment :No significant side effects such as major sleep issues and mood changes, chest pain, shortness of breath, headaches ,  significant weight loss.  Gi  Off and on ranitidine  ocass  Sx even though could be related to stress. Resp:   Pre exercise inhaler helps  Allergy  Sx this spring stable   ROS: See pertinent positives and negatives per HPI. No cp sob depressive sx  Concerns  Denies sig sleep disturbance   Past Medical History:  Diagnosis Date  . Acne   . Attention deficit    Carey Bullocks  psychology in past  . Dysmenorrhea 11/29/2007   Qualifier: Diagnosis of  By: Fabian Sharp MD, [name]   . Exercise-induced asthma   . Exercise-induced asthma   . Frontal sinusitis 07/21/2012  . GERD (gastroesophageal reflux disease)    does well on ranitidine endo 2010  . History of infectious mononucleosis 10/06  . Hx of varicella   . Pharyngitis 11/09/2011   With acute on history of chronic sinusitis    Family History  Problem Relation Age of Onset  . ADD / ADHD Sister     Social History   Social History  . Marital status: Single    Spouse name: N/A  . Number of children: N/A  . Years of education: N/A   Social History Main Topics  . Smoking status: Never Smoker  . Smokeless tobacco: Never Used  . Alcohol use Yes     Comment: occ  . Drug use: No  . Sexual activity: Not Currently    Birth control/ protection: Pill   Other Topics Concern  . None   Social History Narrative   [employer]  Good grades minor in psychology major exercise sports  physiology   NOn smoker    [employer] [address]  [address] 2015   Graduating may 18  To work  [employer]              Outpatient Medications Prior to Visit  Medication Sig Dispense Refill  . loratadine (CLARITIN) 10 MG tablet Take 10 mg by mouth daily as needed for allergies.    . ranitidine (ZANTAC) 300 MG tablet Take 1 tablet (300 mg total) by mouth 2 (two) times daily. 180 tablet 3  . VENTOLIN HFA 108 (90 Base) MCG/ACT inhaler INHALE 2 PUFFS BY MOUTH EVERY 6 HOURS AS NEEDED 18 g 3  . amphetamine-dextroamphetamine (ADDERALL XR) 20 MG 24 hr capsule Take 1 capsule (20 mg total) by mouth every morning. 90 capsule 0  . amphetamine-dextroamphetamine (ADDERALL) 10 MG tablet 1/2 -1 po add on for homework 50 tablet 0   No facility-administered medications prior to visit.      EXAM:  BP 102/80 (BP Location: Left Arm, Patient Position: Sitting, Cuff Size: Normal)   Pulse 88   Temp 97.9 F (36.6 C) (Oral)   Wt 142 lb (64.4 kg)  SpO2 98%   BMI 19.80 kg/m   Body mass index is 19.8 kg/m.  GENERAL: vitals reviewed and listed above, alert, oriented, appears well hydrated and in no acute distress HEENT: atraumatic, conjunctiva  clear, no obvious abnormalities on inspection of external nose and ears NECK: no obvious masses on inspection palpation  LUNGS: clear to auscultation bilaterally, no wheezes, rales or rhonchi, good air movement CV: HRRR, no clubbing cyanosis or  peripheral edema nl cap refill  MS: moves all extremities without noticeable focal  abnormality PSYCH: pleasant and cooperative, no obvious depression or anxiety  BP Readings from Last 3 Encounters:  08/20/16 102/80  11/28/15 122/78  11/27/15 122/80   Wt Readings from Last 3 Encounters:  08/20/16 142 lb (64.4 kg)  11/28/15 149 lb (67.6 kg)  11/27/15 149 lb (67.6 kg)     ASSESSMENT AND PLAN:  Discussed the following assessment and plan:  Attention deficit disorder, unspecified hyperactivity  presence  Medication management  ASTHMA, EXERCISE INDUCED  Gastroesophageal reflux disease, esophagitis presence not specified Gyne check per dr Lily Peerfernandez.   .   Risk benefit of medication discussed. Benefit more than risk  Advise continue on XR    Limit use of add on ir as she is doing with  Plan of not add on med after transition.   GI sx could be aggravated by stress   manageable  Cont on pre exercise  Bronchodilator  Total visit 26mins > 50% spent counseling and coordinating care as indicated in above note and in instructions to patient .    -Patient advised to return or notify health care team  if  new concerns arise.  Patient Instructions  congrats on graduating !  Med can be refilled end of June.   If not found a medical home  FU in 6 months    WE NOW OFFER   [facility] FAST TRACK!!!  SAME DAY Appointments for ACUTE CARE  Such as: Sprains, Injuries, cuts, abrasions, rashes, muscle pain, joint pain, back pain Colds, flu, sore throats, headache, allergies, cough, fever  Ear pain, sinus and eye infections Abdominal pain, nausea, vomiting, diarrhea, upset stomach Animal/insect bites  3 Easy Ways to Schedule: Walk-In Scheduling Call in scheduling Mychart Sign-up: [email or web address]            Neta MendsWanda K. Gayatri Teasdale M.D.

## 2016-08-19 DIAGNOSIS — L710 Perioral dermatitis: Secondary | ICD-10-CM | POA: Diagnosis not present

## 2016-08-19 DIAGNOSIS — L700 Acne vulgaris: Secondary | ICD-10-CM | POA: Diagnosis not present

## 2016-08-19 MED FILL — metroNIDAZOLE 0.75 % GEL: 0.75 | 30 days supply | Qty: 45 | Fill #0

## 2016-08-20 ENCOUNTER — Ambulatory Visit (INDEPENDENT_AMBULATORY_CARE_PROVIDER_SITE_OTHER): Payer: 59 | Admitting: Internal Medicine

## 2016-08-20 ENCOUNTER — Encounter: Payer: Self-pay | Admitting: Internal Medicine

## 2016-08-20 ENCOUNTER — Other Ambulatory Visit: Payer: Self-pay | Admitting: Internal Medicine

## 2016-08-20 VITALS — BP 102/80 | HR 88 | Temp 97.9°F | Wt 142.0 lb

## 2016-08-20 DIAGNOSIS — J45990 Exercise induced bronchospasm: Secondary | ICD-10-CM | POA: Diagnosis not present

## 2016-08-20 DIAGNOSIS — F988 Other specified behavioral and emotional disorders with onset usually occurring in childhood and adolescence: Secondary | ICD-10-CM

## 2016-08-20 DIAGNOSIS — Z79899 Other long term (current) drug therapy: Secondary | ICD-10-CM

## 2016-08-20 DIAGNOSIS — K219 Gastro-esophageal reflux disease without esophagitis: Secondary | ICD-10-CM

## 2016-08-20 MED ORDER — AMPHETAMINE-DEXTROAMPHETAMINE 10 MG PO TABS
ORAL_TABLET | ORAL | 0 refills | Status: DC
Start: 2016-08-20 — End: 2017-01-12

## 2016-08-20 MED ORDER — AMPHETAMINE-DEXTROAMPHET ER 20 MG PO CP24: 20.0000 mg | ORAL_CAPSULE | ORAL | 0 refills | Status: DC

## 2016-08-20 MED FILL — raNITIdine HCL 300 MG TABS: 300 | 90 days supply | Qty: 180 | Fill #1

## 2016-08-20 NOTE — Patient Instructions (Addendum)
congrats on graduating !  Med can be refilled end of June.   If not found a medical home  FU in 6 months    WE NOW OFFER   [facility] FAST TRACK!!!  SAME DAY Appointments for ACUTE CARE  Such as: Sprains, Injuries, cuts, abrasions, rashes, muscle pain, joint pain, back pain Colds, flu, sore throats, headache, allergies, cough, fever  Ear pain, sinus and eye infections Abdominal pain, nausea, vomiting, diarrhea, upset stomach Animal/insect bites  3 Easy Ways to Schedule: Walk-In Scheduling Call in scheduling Mychart Sign-up: [email or web address]

## 2016-08-22 ENCOUNTER — Other Ambulatory Visit: Payer: Self-pay | Admitting: Internal Medicine

## 2016-08-22 ENCOUNTER — Ambulatory Visit (INDEPENDENT_AMBULATORY_CARE_PROVIDER_SITE_OTHER): Payer: 59 | Admitting: Gynecology

## 2016-08-22 ENCOUNTER — Encounter: Payer: Self-pay | Admitting: Gynecology

## 2016-08-22 VITALS — BP 108/72 | Ht 70.75 in | Wt 143.0 lb

## 2016-08-22 DIAGNOSIS — Z01419 Encounter for gynecological examination (general) (routine) without abnormal findings: Secondary | ICD-10-CM | POA: Diagnosis not present

## 2016-08-22 DIAGNOSIS — N946 Dysmenorrhea, unspecified: Secondary | ICD-10-CM

## 2016-08-22 DIAGNOSIS — R87612 Low grade squamous intraepithelial lesion on cytologic smear of cervix (LGSIL): Secondary | ICD-10-CM | POA: Diagnosis not present

## 2016-08-22 LAB — COMPREHENSIVE METABOLIC PANEL
ALK PHOS: 31 U/L — AB (ref 33–115)
ALT: 13 U/L (ref 6–29)
AST: 14 U/L (ref 10–30)
Albumin: 4.5 g/dL (ref 3.6–5.1)
BUN: 9 mg/dL (ref 7–25)
CO2: 26 mmol/L (ref 20–31)
CREATININE: 0.74 mg/dL (ref 0.50–1.10)
Calcium: 9 mg/dL (ref 8.6–10.2)
Chloride: 102 mmol/L (ref 98–110)
GLUCOSE: 88 mg/dL (ref 65–99)
POTASSIUM: 4 mmol/L (ref 3.5–5.3)
SODIUM: 135 mmol/L (ref 135–146)
Total Bilirubin: 0.5 mg/dL (ref 0.2–1.2)
Total Protein: 7.1 g/dL (ref 6.1–8.1)

## 2016-08-22 LAB — CBC WITH DIFFERENTIAL/PLATELET
BASOS PCT: 2 %
Basophils Absolute: 110 cells/uL (ref 0–200)
EOS ABS: 165 {cells}/uL (ref 15–500)
EOS PCT: 3 %
HCT: 38.8 % (ref 35.0–45.0)
Hemoglobin: 12.9 g/dL (ref 11.7–15.5)
LYMPHS PCT: 51 %
Lymphs Abs: 2805 cells/uL (ref 850–3900)
MCH: 30.4 pg (ref 27.0–33.0)
MCHC: 33.2 g/dL (ref 32.0–36.0)
MCV: 91.5 fL (ref 80.0–100.0)
MONOS PCT: 9 %
MPV: 8.9 fL (ref 7.5–12.5)
Monocytes Absolute: 495 cells/uL (ref 200–950)
NEUTROS ABS: 1925 {cells}/uL (ref 1500–7800)
Neutrophils Relative %: 35 %
PLATELETS: 300 10*3/uL (ref 140–400)
RBC: 4.24 MIL/uL (ref 3.80–5.10)
RDW: 12.9 % (ref 11.0–15.0)
WBC: 5.5 10*3/uL (ref 3.8–10.8)

## 2016-08-22 LAB — LIPID PANEL
CHOL/HDL RATIO: 1.9 ratio (ref <5.0)
Cholesterol: 155 mg/dL (ref <200)
HDL: 83 mg/dL (ref >50)
LDL Cholesterol: 51 mg/dL (ref <100)
Triglycerides: 103 mg/dL (ref <150)
VLDL: 21 mg/dL (ref <30)

## 2016-08-22 MED ORDER — IBUPROFEN 800 MG PO TABS: 800.0000 mg | ORAL_TABLET | Freq: Three times a day (TID) | ORAL | 5 refills | Status: AC | PRN

## 2016-08-22 MED ORDER — FLUCONAZOLE 150 MG PO TABS: 150.0000 mg | ORAL_TABLET | Freq: Once | ORAL | 4 refills | Status: AC

## 2016-08-22 MED ORDER — FLUCONAZOLE 150 MG PO TABS: 150.0000 mg | ORAL_TABLET | Freq: Once | ORAL | 0 refills | Status: DC

## 2016-08-22 MED FILL — VENTOLIN HFA 90 MCG INHALER: 108 (90 BAS | 25 days supply | Qty: 18 | Fill #0

## 2016-08-22 MED FILL — IBUPROFEN 800 MG TAB: 800 | 10 days supply | Qty: 30 | Fill #0

## 2016-08-22 MED FILL — FLUCONAZOLE 150 MG TABLET: 150 | 1 days supply | Qty: 1 | Fill #0

## 2016-08-22 NOTE — Progress Notes (Signed)
Amanda Garrison [birth year] [id number]   History:    26 y.o.  for annual gyn exam who had a Skyla IUD placed last year and has done well with the exception of dysmenorrhea time of her menstrual cycles. She reports no intermenstrual spotting. Several years ago she had the HPV vaccine series. She's had no history of any abnormal Pap smears. It appears as that she has been off the oral contraceptive pill and on the progesterone only IUD her blood pressures better and she has lost weight. She is happy with these results.  Past medical history,surgical history, family history and social history were all reviewed and documented in the EPIC chart.  Gynecologic History Patient's last menstrual period was 08/12/2016 (approximate). Contraception: IUD Last Pap: 2015. Results were: normal Last mammogram: Not indicated. Results were: Not indicated  Obstetric History OB History  Gravida Para Term Preterm AB Living  0 0 0 0 0 0  SAB TAB Ectopic Multiple Live Births  0 0 0 0 0         ROS: A ROS was performed and pertinent positives and negatives are included in the history.  GENERAL: No fevers or chills. HEENT: No change in vision, no earache, sore throat or sinus congestion. NECK: No pain or stiffness. CARDIOVASCULAR: No chest pain or pressure. No palpitations. PULMONARY: No shortness of breath, cough or wheeze. GASTROINTESTINAL: No abdominal pain, nausea, vomiting or diarrhea, melena or bright red blood per rectum. GENITOURINARY: No urinary frequency, urgency, hesitancy or dysuria. MUSCULOSKELETAL: No joint or muscle pain, no back pain, no recent trauma. DERMATOLOGIC: No rash, no itching, no lesions. ENDOCRINE: No polyuria, polydipsia, no heat or cold intolerance. No recent change in weight. HEMATOLOGICAL: No anemia or easy bruising or bleeding. NEUROLOGIC: No headache, seizures, numbness, tingling or weakness. PSYCHIATRIC: No depression, no loss of interest in normal activity or change in sleep  pattern.     Exam: chaperone present  BP 108/72   Ht 5' 10.75" (1.797 m)   Wt 143 lb (64.9 kg)   LMP 08/12/2016 (Approximate)   BMI 20.09 kg/m   Body mass index is 20.09 kg/m.  General appearance : Well developed well nourished female. No acute distress HEENT: Eyes: no retinal hemorrhage or exudates,  Neck supple, trachea midline, no carotid bruits, no thyroidmegaly Lungs: Clear to auscultation, no rhonchi or wheezes, or rib retractions  Heart: Regular rate and rhythm, no murmurs or gallops Breast:Examined in sitting and supine position were symmetrical in appearance, no palpable masses or tenderness,  no skin retraction, no nipple inversion, no nipple discharge, no skin discoloration, no axillary or supraclavicular lymphadenopathy Abdomen: no palpable masses or tenderness, no rebound or guarding Extremities: no edema or skin discoloration or tenderness  Pelvic:  Bartholin, Urethra, Skene Glands: Within normal limits             Vagina: No gross lesions or discharge  Cervix: No gross lesions or discharge tip of the IUD string seen in the endocervical canal  Uterus  anteverted, normal size, shape and consistency, non-tender and mobile  Adnexa  Without masses or tenderness  Anus and perineum  normal   Rectovaginal  normal sphincter tone without palpated masses or tenderness             Hemoccult not indicated     Assessment/Plan:  27 y.o. female for annual exam doing well for her dysmenorrhea she'll be prescribed Motrin 800 mg take 1 by mouth 3 times a day when necessary. Patient has  seen the dermatologist because of acne and rosacea and she's on antibiotic and recently developed a yeast infection a prescription for Diflucan 150 mg one by mouth was provided along with several refills. Pap smear without HPV screening done today as per guidelines. The following fasting screening blood work was ordered today: Comprehensive metabolic panel, fasting lipid profile, CBC and  urinalysis.   Ok EdwardsFERNANDEZ,Owen Pratte H MD, 8:31 AM 08/22/2016

## 2016-08-22 NOTE — Telephone Encounter (Signed)
This should've already  been sent in check records and find out why we're getting another request. Should've been refill.

## 2016-08-23 LAB — URINALYSIS W MICROSCOPIC + REFLEX CULTURE
BACTERIA UA: NONE SEEN [HPF]
Bilirubin Urine: NEGATIVE
CRYSTALS: NONE SEEN [HPF]
Casts: NONE SEEN [LPF]
GLUCOSE, UA: NEGATIVE
HGB URINE DIPSTICK: NEGATIVE
Ketones, ur: NEGATIVE
Leukocytes, UA: NEGATIVE
Nitrite: NEGATIVE
PROTEIN: NEGATIVE
Specific Gravity, Urine: 1.023 (ref 1.001–1.035)
YEAST: NONE SEEN [HPF]
pH: 6 (ref 5.0–8.0)

## 2016-08-24 LAB — URINE CULTURE: ORGANISM ID, BACTERIA: NO GROWTH

## 2016-08-26 LAB — PAP IG W/ RFLX HPV ASCU

## 2016-08-27 ENCOUNTER — Encounter: Payer: Self-pay | Admitting: Gynecology

## 2016-08-29 ENCOUNTER — Encounter: Payer: 59 | Admitting: Gynecology

## 2016-09-05 ENCOUNTER — Encounter: Payer: Self-pay | Admitting: Gynecology

## 2016-09-05 ENCOUNTER — Telehealth: Payer: Self-pay

## 2016-09-05 NOTE — Telephone Encounter (Signed)
Patient called today about results from visit on 08/22/16. Looks like CMET had abnormal value and PAP was abnormal. She would like to know about these stating she will be in town for one day next week.

## 2016-09-05 NOTE — Telephone Encounter (Signed)
Please inform patient her liver function test was normal alkaline phosphatase is considered abnormally elevated value is elevated is not low. Her Pap smear demonstrated low-grade dysplasia to schedule colposcopy appointment for her. She was able to see her result before I came across it

## 2016-09-05 NOTE — Telephone Encounter (Signed)
Patient informed. Appt made for next week when she will be home.

## 2016-09-12 ENCOUNTER — Ambulatory Visit (INDEPENDENT_AMBULATORY_CARE_PROVIDER_SITE_OTHER): Payer: 59 | Admitting: Gynecology

## 2016-09-12 ENCOUNTER — Encounter: Payer: Self-pay | Admitting: Gynecology

## 2016-09-12 ENCOUNTER — Other Ambulatory Visit: Payer: Self-pay | Admitting: Gynecology

## 2016-09-12 VITALS — BP 132/80

## 2016-09-12 DIAGNOSIS — N870 Mild cervical dysplasia: Secondary | ICD-10-CM | POA: Diagnosis not present

## 2016-09-12 DIAGNOSIS — R87612 Low grade squamous intraepithelial lesion on cytologic smear of cervix (LGSIL): Secondary | ICD-10-CM | POA: Diagnosis not present

## 2016-09-12 DIAGNOSIS — N871 Moderate cervical dysplasia: Secondary | ICD-10-CM

## 2016-09-12 HISTORY — DX: Moderate cervical dysplasia: N87.1

## 2016-09-12 MED FILL — FLUCONAZOLE 150 MG TABLET: 150 | 2 days supply | Qty: 2 | Fill #0

## 2016-09-12 MED FILL — AMPICILLIN TR 500 MG CAP: 500 | 60 days supply | Qty: 120 | Fill #0

## 2016-09-12 NOTE — Addendum Note (Signed)
Addended by: Kem ParkinsonBARNES, Hays Dunnigan on: 09/12/2016 03:22 PM   Modules accepted: Orders

## 2016-09-12 NOTE — Progress Notes (Signed)
   Patient is a 26 year old that presented to office today for colposcopic evaluation as a result of her recent Pap smear time of her annual exam 1 08/22/2016 which demonstrated low-grade squamous intraepithelial lesion. Patient had a Skyla IUD placed last year and has done well. Several years ago she had the HPV vaccine series. She's had no history of any abnormal Pap smears until this most recent one. Patient was counseled for colposcopic evaluation.  Patient underwent a detail colposcopic evaluation. The external genitalia, perineum and perirectal region were inspected and no lesions were seen. The speculum was introduced into the vagina and acetic acid applied. A systematic inspection of the vaginal wall, fornix and cervix was undertaken. A small acetowhite area at the 6:00 position was noted which was biopsied. The transformation zone was visualized entirely in an ECC was obtained as well. Silver no true was used for hemostasis.  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 The ASCCP Guidelines were shared with the patient on diagnosing and management of low-grade dysplasia. If her biopsy concurs with cytology she will need a follow-up Pap smear in 12 months in 24 months respectively.

## 2016-09-12 NOTE — Patient Instructions (Signed)

## 2016-09-16 ENCOUNTER — Encounter: Payer: Self-pay | Admitting: Gynecology

## 2016-09-17 NOTE — Telephone Encounter (Signed)
Patient was contacted via telephone today to inform her the result of her colposcopic directed biopsy that was done in the office on 09/12/2016 as a result of her Pap smear in 08/22/2016 having demonstrated low-grade squamous intraepithelial lesion.  Her colposcopic directed biopsy pathology report demonstrated the following:  Diagnosis 1. Cervix, biopsy, 6:00 o'clock - LOW GRADE SQUAMOUS INTRAEPITHELIAL LESION (CIN-I). - A SMALL FOCUS CONCERNING FOR HIGH GRADE SQUAMOUS INTRAEPITHELIAL LESION (CIN-II). 2. Endocervix, curettage - BENIGN ENDOCERVICAL MUCOSA ADMIXED WITH FRAGMENTS OF BENIGN SQUAMOUS MUCOSA. Microscopic Comment 1. The background shows low grade squamous intraepithelial lesion (CIN-I). On limited levels of sectioning, there is also a focus concerning for high grade squamous intraepithelial lesion (CIN-II)  I explained to her the new guidelines by the American Society of colposcopy and cervical pathology of management of patient's was CIN-2 in her age group of 26 years of age. I have explained to her that women in her age was CIN-2 less than 26 years of age had a regression rate of the dysplasia of 60% and a regression across all women 50% at 24 months.   I explained to her that either treatment or observation is acceptable if colposcopy is adequate such as in her case. She has decided to proceed with observation to include cytology and colposcopy every 6 months for 12 months. If cytology and colposcopy are negative for 2 visits, HPV and cytology code testing should be performed one year later. If her testing is negative, co-testing should be performed in 3 years. Colposcopy should be performed if the two-year or five-year CLOtest is abnormal. If colposcopic appearance of the lesion worsens or if high-grade dysplasia as noted repeat biopsy is recommended. If CIN-2 or 3 persist for 24 months and treatment is the preferred approach.  [place] and will  be establishing with a new gynecologist in that area. I'm sending her a copy of these notes as well as the Pap smear and pathology report to share with her new gynecologist.

## 2016-10-09 MED FILL — DEXTROAMP-AMP 10 MG TAB: 10 | 50 days supply | Qty: 50 | Fill #0

## 2016-10-21 MED FILL — ADDERALL XR 20 MG CAP SA: 20 | 90 days supply | Qty: 90 | Fill #0

## 2016-11-10 MED FILL — IBUPROFEN 800 MG TAB: 800 | 30 days supply | Qty: 90 | Fill #1

## 2017-01-02 ENCOUNTER — Encounter: Payer: Self-pay | Admitting: Internal Medicine

## 2017-01-12 ENCOUNTER — Telehealth: Payer: Self-pay | Admitting: Internal Medicine

## 2017-01-14 MED ORDER — AMPHETAMINE-DEXTROAMPHET ER 20 MG PO CP24: 20.0000 mg | ORAL_CAPSULE | ORAL | 0 refills | Status: AC

## 2017-01-14 MED ORDER — AMPHETAMINE-DEXTROAMPHETAMINE 10 MG PO TABS
ORAL_TABLET | ORAL | 0 refills | Status: AC
Start: 2017-01-14 — End: 2018-01-07

## 2017-01-14 NOTE — Telephone Encounter (Signed)
Left message on machine to call back about rxs being printed

## 2017-01-14 NOTE — Telephone Encounter (Signed)
Both last filled 08/20/16  qty: 90  qty:50 OV 08/20/16

## 2017-01-14 NOTE — Telephone Encounter (Signed)
Ok to refill both x 1   Due for ob before runs out.

## 2017-01-15 NOTE — Telephone Encounter (Signed)
Patient called regarding her prescription. I did not see RX in the drawer at the front. I made patient aware we would return her call regarding prescription

## 2017-01-15 NOTE — Telephone Encounter (Signed)
Rx were placed up front for pick up

## 2017-01-15 NOTE — Telephone Encounter (Signed)
Checked up front and I had placed rx in red rx folder but was told that they no longer use that folder. This has been filed under pt's last name up front. Attempted to reach pt, phone went to vm, left message that rx is ready for pick up

## 2017-01-23 MED FILL — DEXTROAMP-AMP 10 MG TAB: 10 | 90 days supply | Qty: 50 | Fill #0

## 2017-01-23 MED FILL — DEXTROAMP-AMPHET ER 20 MG C: 20 | 90 days supply | Qty: 90 | Fill #0

## 2019-01-03 ENCOUNTER — Encounter: Payer: Self-pay | Admitting: Gynecology
